# Patient Record
Sex: Female | Born: 1975 | Race: White | Hispanic: No | Marital: Married | Smoking: Former smoker
Health system: Southern US, Community
[De-identification: ages and names within clinical notes are randomized; demographics above are authoritative.]

## PROBLEM LIST (undated history)

## (undated) DIAGNOSIS — Z87442 Personal history of urinary calculi: Secondary | ICD-10-CM

## (undated) DIAGNOSIS — Z8669 Personal history of other diseases of the nervous system and sense organs: Secondary | ICD-10-CM

## (undated) DIAGNOSIS — R350 Frequency of micturition: Secondary | ICD-10-CM

## (undated) DIAGNOSIS — Z86718 Personal history of other venous thrombosis and embolism: Secondary | ICD-10-CM

## (undated) DIAGNOSIS — F1910 Other psychoactive substance abuse, uncomplicated: Secondary | ICD-10-CM

## (undated) DIAGNOSIS — M1990 Unspecified osteoarthritis, unspecified site: Secondary | ICD-10-CM

## (undated) DIAGNOSIS — M549 Dorsalgia, unspecified: Secondary | ICD-10-CM

## (undated) DIAGNOSIS — K589 Irritable bowel syndrome without diarrhea: Secondary | ICD-10-CM

## (undated) DIAGNOSIS — K2900 Acute gastritis without bleeding: Secondary | ICD-10-CM

## (undated) DIAGNOSIS — R51 Headache: Secondary | ICD-10-CM

## (undated) DIAGNOSIS — F419 Anxiety disorder, unspecified: Secondary | ICD-10-CM

## (undated) DIAGNOSIS — G4700 Insomnia, unspecified: Secondary | ICD-10-CM

## (undated) DIAGNOSIS — R3915 Urgency of urination: Secondary | ICD-10-CM

## (undated) DIAGNOSIS — R1110 Vomiting, unspecified: Secondary | ICD-10-CM

## (undated) DIAGNOSIS — M2550 Pain in unspecified joint: Secondary | ICD-10-CM

## (undated) DIAGNOSIS — J45909 Unspecified asthma, uncomplicated: Secondary | ICD-10-CM

## (undated) DIAGNOSIS — K219 Gastro-esophageal reflux disease without esophagitis: Secondary | ICD-10-CM

## (undated) DIAGNOSIS — T7840XA Allergy, unspecified, initial encounter: Secondary | ICD-10-CM

## (undated) DIAGNOSIS — M2540 Effusion, unspecified joint: Secondary | ICD-10-CM

## (undated) DIAGNOSIS — K2970 Gastritis, unspecified, without bleeding: Secondary | ICD-10-CM

## (undated) DIAGNOSIS — J189 Pneumonia, unspecified organism: Secondary | ICD-10-CM

## (undated) DIAGNOSIS — G8929 Other chronic pain: Secondary | ICD-10-CM

## (undated) DIAGNOSIS — G709 Myoneural disorder, unspecified: Secondary | ICD-10-CM

## (undated) DIAGNOSIS — N3281 Overactive bladder: Secondary | ICD-10-CM

## (undated) DIAGNOSIS — IMO0001 Reserved for inherently not codable concepts without codable children: Secondary | ICD-10-CM

## (undated) HISTORY — DX: Unspecified asthma, uncomplicated: J45.909

## (undated) HISTORY — DX: Myoneural disorder, unspecified: G70.9

## (undated) HISTORY — DX: Other psychoactive substance abuse, uncomplicated: F19.10

## (undated) HISTORY — DX: Irritable bowel syndrome, unspecified: K58.9

## (undated) HISTORY — PX: OTHER SURGICAL HISTORY: SHX169

## (undated) HISTORY — DX: Unspecified osteoarthritis, unspecified site: M19.90

## (undated) HISTORY — PX: ECTOPIC PREGNANCY SURGERY: SHX613

## (undated) HISTORY — PX: COLONOSCOPY W/ POLYPECTOMY: SHX1380

## (undated) HISTORY — DX: Personal history of other venous thrombosis and embolism: Z86.718

## (undated) HISTORY — DX: Pneumonia, unspecified organism: J18.9

## (undated) HISTORY — DX: Allergy, unspecified, initial encounter: T78.40XA

---

## 1998-01-29 ENCOUNTER — Emergency Department (HOSPITAL_COMMUNITY): Admission: EM | Admit: 1998-01-29 | Discharge: 1998-01-29 | Payer: Self-pay | Admitting: Emergency Medicine

## 1999-03-12 ENCOUNTER — Emergency Department (HOSPITAL_COMMUNITY): Admission: EM | Admit: 1999-03-12 | Discharge: 1999-03-12 | Payer: Self-pay | Admitting: Emergency Medicine

## 1999-03-12 ENCOUNTER — Encounter: Payer: Self-pay | Admitting: Emergency Medicine

## 1999-08-14 ENCOUNTER — Emergency Department (HOSPITAL_COMMUNITY): Admission: EM | Admit: 1999-08-14 | Discharge: 1999-08-14 | Payer: Self-pay | Admitting: Emergency Medicine

## 1999-08-24 ENCOUNTER — Emergency Department (HOSPITAL_COMMUNITY): Admission: EM | Admit: 1999-08-24 | Discharge: 1999-08-24 | Payer: Self-pay | Admitting: Emergency Medicine

## 2001-09-05 ENCOUNTER — Emergency Department (HOSPITAL_COMMUNITY): Admission: EM | Admit: 2001-09-05 | Discharge: 2001-09-05 | Payer: Self-pay

## 2001-12-15 ENCOUNTER — Emergency Department (HOSPITAL_COMMUNITY): Admission: EM | Admit: 2001-12-15 | Discharge: 2001-12-15 | Payer: Self-pay | Admitting: Emergency Medicine

## 2002-09-20 ENCOUNTER — Encounter: Payer: Self-pay | Admitting: Emergency Medicine

## 2002-09-20 ENCOUNTER — Emergency Department (HOSPITAL_COMMUNITY): Admission: EM | Admit: 2002-09-20 | Discharge: 2002-09-20 | Payer: Self-pay | Admitting: Diagnostic Radiology

## 2002-10-31 ENCOUNTER — Encounter: Admission: RE | Admit: 2002-10-31 | Discharge: 2003-01-29 | Payer: Self-pay | Admitting: *Deleted

## 2002-11-25 ENCOUNTER — Emergency Department (HOSPITAL_COMMUNITY): Admission: EM | Admit: 2002-11-25 | Discharge: 2002-11-25 | Payer: Self-pay | Admitting: Emergency Medicine

## 2003-06-21 ENCOUNTER — Emergency Department (HOSPITAL_COMMUNITY): Admission: EM | Admit: 2003-06-21 | Discharge: 2003-06-21 | Payer: Self-pay | Admitting: Emergency Medicine

## 2003-10-06 ENCOUNTER — Emergency Department (HOSPITAL_COMMUNITY): Admission: EM | Admit: 2003-10-06 | Discharge: 2003-10-06 | Payer: Self-pay | Admitting: Emergency Medicine

## 2004-04-03 ENCOUNTER — Emergency Department (HOSPITAL_COMMUNITY): Admission: EM | Admit: 2004-04-03 | Discharge: 2004-04-03 | Payer: Self-pay | Admitting: Emergency Medicine

## 2004-05-02 ENCOUNTER — Emergency Department (HOSPITAL_COMMUNITY): Admission: EM | Admit: 2004-05-02 | Discharge: 2004-05-02 | Payer: Self-pay | Admitting: Emergency Medicine

## 2004-07-21 ENCOUNTER — Emergency Department (HOSPITAL_COMMUNITY): Admission: EM | Admit: 2004-07-21 | Discharge: 2004-07-21 | Payer: Self-pay | Admitting: Emergency Medicine

## 2004-11-07 ENCOUNTER — Emergency Department (HOSPITAL_COMMUNITY): Admission: EM | Admit: 2004-11-07 | Discharge: 2004-11-07 | Payer: Self-pay | Admitting: Emergency Medicine

## 2005-02-28 ENCOUNTER — Emergency Department (HOSPITAL_COMMUNITY): Admission: EM | Admit: 2005-02-28 | Discharge: 2005-02-28 | Payer: Self-pay | Admitting: Emergency Medicine

## 2005-03-20 ENCOUNTER — Emergency Department (HOSPITAL_COMMUNITY): Admission: EM | Admit: 2005-03-20 | Discharge: 2005-03-20 | Payer: Self-pay | Admitting: Emergency Medicine

## 2005-04-18 ENCOUNTER — Encounter (INDEPENDENT_AMBULATORY_CARE_PROVIDER_SITE_OTHER): Payer: Self-pay | Admitting: Specialist

## 2005-04-18 ENCOUNTER — Observation Stay (HOSPITAL_COMMUNITY): Admission: AD | Admit: 2005-04-18 | Discharge: 2005-04-19 | Payer: Self-pay | Admitting: *Deleted

## 2005-04-20 ENCOUNTER — Inpatient Hospital Stay (HOSPITAL_COMMUNITY): Admission: AD | Admit: 2005-04-20 | Discharge: 2005-04-20 | Payer: Self-pay | Admitting: Obstetrics & Gynecology

## 2005-06-20 ENCOUNTER — Emergency Department (HOSPITAL_COMMUNITY): Admission: EM | Admit: 2005-06-20 | Discharge: 2005-06-21 | Payer: Self-pay | Admitting: Emergency Medicine

## 2005-08-27 ENCOUNTER — Emergency Department (HOSPITAL_COMMUNITY): Admission: EM | Admit: 2005-08-27 | Discharge: 2005-08-27 | Payer: Self-pay | Admitting: Emergency Medicine

## 2006-03-31 ENCOUNTER — Inpatient Hospital Stay (HOSPITAL_COMMUNITY): Admission: AD | Admit: 2006-03-31 | Discharge: 2006-04-01 | Payer: Self-pay | Admitting: Gynecology

## 2006-06-14 ENCOUNTER — Emergency Department (HOSPITAL_COMMUNITY): Admission: EM | Admit: 2006-06-14 | Discharge: 2006-06-14 | Payer: Self-pay | Admitting: Emergency Medicine

## 2006-06-27 ENCOUNTER — Emergency Department (HOSPITAL_COMMUNITY): Admission: EM | Admit: 2006-06-27 | Discharge: 2006-06-27 | Payer: Self-pay | Admitting: Emergency Medicine

## 2006-12-28 ENCOUNTER — Emergency Department (HOSPITAL_COMMUNITY): Admission: EM | Admit: 2006-12-28 | Discharge: 2006-12-28 | Payer: Self-pay | Admitting: Emergency Medicine

## 2007-04-12 ENCOUNTER — Emergency Department (HOSPITAL_COMMUNITY): Admission: EM | Admit: 2007-04-12 | Discharge: 2007-04-12 | Payer: Self-pay | Admitting: Emergency Medicine

## 2008-01-08 ENCOUNTER — Emergency Department (HOSPITAL_COMMUNITY): Admission: EM | Admit: 2008-01-08 | Discharge: 2008-01-09 | Payer: Self-pay | Admitting: Emergency Medicine

## 2009-10-10 ENCOUNTER — Emergency Department (HOSPITAL_COMMUNITY): Admission: EM | Admit: 2009-10-10 | Discharge: 2009-10-10 | Payer: Self-pay | Admitting: Family Medicine

## 2010-06-27 ENCOUNTER — Emergency Department (HOSPITAL_COMMUNITY)
Admission: EM | Admit: 2010-06-27 | Discharge: 2010-06-27 | Payer: Self-pay | Source: Home / Self Care | Admitting: Family Medicine

## 2010-07-07 ENCOUNTER — Emergency Department (HOSPITAL_COMMUNITY)
Admission: EM | Admit: 2010-07-07 | Discharge: 2010-07-07 | Payer: Self-pay | Source: Home / Self Care | Admitting: Emergency Medicine

## 2010-09-12 ENCOUNTER — Inpatient Hospital Stay (INDEPENDENT_AMBULATORY_CARE_PROVIDER_SITE_OTHER)
Admission: RE | Admit: 2010-09-12 | Discharge: 2010-09-12 | Disposition: A | Discharge status: Home / Self Care | Payer: Self-pay | Source: Ambulatory Visit | Attending: Family Medicine | Admitting: Family Medicine

## 2010-09-12 DIAGNOSIS — H60399 Other infective otitis externa, unspecified ear: Secondary | ICD-10-CM

## 2010-09-12 DIAGNOSIS — H7290 Unspecified perforation of tympanic membrane, unspecified ear: Secondary | ICD-10-CM

## 2010-09-20 ENCOUNTER — Inpatient Hospital Stay (INDEPENDENT_AMBULATORY_CARE_PROVIDER_SITE_OTHER)
Admission: RE | Admit: 2010-09-20 | Discharge: 2010-09-20 | Disposition: A | Discharge status: Home / Self Care | Payer: Self-pay | Source: Ambulatory Visit | Attending: Family Medicine | Admitting: Family Medicine

## 2010-09-20 DIAGNOSIS — Z23 Encounter for immunization: Secondary | ICD-10-CM

## 2010-09-20 DIAGNOSIS — T148XXA Other injury of unspecified body region, initial encounter: Secondary | ICD-10-CM

## 2010-10-06 LAB — POCT URINALYSIS DIP (DEVICE)
Ketones, ur: NEGATIVE mg/dL
Protein, ur: 30 mg/dL — AB
pH: 8.5 — ABNORMAL HIGH (ref 5.0–8.0)

## 2010-10-06 LAB — URINE CULTURE: Colony Count: 60000

## 2010-11-28 NOTE — Op Note (Signed)
NAMEPARISSA, Sheena Young            ACCOUNT NO.:  [id number]   MEDICAL RECORD NO.:  [id number]          PATIENT TYPE:  INP   LOCATION:  9317                          FACILITY:  [facility]   PHYSICIAN:  Pershing Cox, M.D.DATE OF BIRTH:  19-Jan-1976   DATE OF PROCEDURE:  04/18/2005  DATE OF DISCHARGE:                                 OPERATIVE REPORT   PREOPERATIVE DIAGNOSIS:  Left ectopic pregnancy.   POSTOPERATIVE DIAGNOSIS:  Left ectopic pregnancy.   PROCEDURE:  Diagnostic laparoscopy and left salpingectomy.   ANESTHESIA:  General endotracheal.   SURGEON:  Pershing Cox, M.D.   ASSISTANT SURGEON:  Chester Holstein. Earlene Plater, M.D.   INDICATIONS FOR PROCEDURE:  This patient ia a 35 year old female gravida 1,  para 0 who came to the emergency room today for pelvic pain.  Her beta HCG  was about 20,000 and an ultrasound showed the left adnexa with a central  fluid collection consistent with an ectopic pregnancy.  No fetal pole was  seen.  She was counseled for the option of surgery or methotrexate, and she  elected to proceed with the surgical procedure and is brought to the  operating room now for this procedure.   OPERATIVE FINDINGS:  There was fluid in the pelvis.  There was diffuse  pelvic inflammatory disease.  There was no upper abdominal disease.  The  right fallopian tube was totally agglutinated at the fimbriated end, and  there were adhesions bailing this fallopian tube and ovary.  On the left,  there was a huge left ectopic pregnancy filling the entire tube.  The tube,  ampulla and fimbria were agglutinated.  There were adhesions covering the  fallopian tube and adhering it to the ovary and to the posterior surface of  uterus.   DESCRIPTION OF PROCEDURE:  The patient was brought to the operating room  with IV in place.  She received 1 gram of Ancef in the holding area.  PAS  stockings were placed on the lower extremities in the operating room.  Supine on the OR table, general  and tracheal anesthesia was administered  after IV sedation.  This was done without difficulty.  She was placed in  stirrups and prepped with Betadine.  A Hulka tenaculum was inserted into the  cervix and used to manipulate the uterus was throughout the procedure.  Marcaine was injected into the subumbilical tissue, and a mid line incision  was made through the umbilicus extending proximal 1 cm down. The  subcutaneous tissues were separated.  The fascia was grasped and incised.  The peritoneum was opened atraumatically, and the Hulka tenaculum was  inserted after a pursestring stitch was placed about fascia.  The  laparoscope was introduced and there were omental adhesions.  For this  reason, it was necessary to place a 5 mm port in the right lower quadrant  which was done atraumatically after Marcaine was injected in the skin.  We  were then able to manipulate the omentum so that we could see the pelvis  well.  A very large ectopic pregnancy was seen on the patient's left.  A  10/12 trocar was placed through an anesthetized skin incision on this side  so that the specimen could be brought out through this incision.  The  fallopian tube was distended.  The fimbria were agglutinated and the tube  was weighted heavily.  For this reason, it could not be easily lifted, and  we started at the area of the tube approximately 1 cm from the uterine  fundus.  Using tripolar cautery, the tube was cauterized and then cut.  Using serial coagulation, the mesosalpinx was brought together with the  tripolar cautery until the mass had been separated from the ovary.  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  This was done with Endo shears.  There  was a large adhesion connecting the ectopic to the uterus and this adhesion  was also taken down prior to the  salpingectomy.  Once the specimen had been  removed with the EndoCatch bag, we irrigated the operative site and pelvis  of all clots.  The pressure was deflated, and there was no evidence of  bleeding at the operative site.  The instruments were removed.  A 0 Vicryl  was used to close the skin edges of the 10/11 port.  The subcutaneous  tissues were brought together with a 0 Vicryl suture at the umbilical site,  and 4-0 Vicryl was used to close the umbilical site and Dermabond was used  to seal the skin of the 10/11 port and the 5 mm port in the right lower  quadrant. The patient tolerated procedure well and was taken to recovery  room in good condition.      Pershing Cox, M.D.  Electronically Signed     MAJ/MEDQ  D:  04/19/2005  T:  04/19/2005  Job:  [id number]

## 2011-04-09 LAB — URINE MICROSCOPIC-ADD ON

## 2011-04-09 LAB — URINALYSIS, ROUTINE W REFLEX MICROSCOPIC
Glucose, UA: NEGATIVE
Nitrite: POSITIVE — AB
Specific Gravity, Urine: 1.023
pH: 6.5

## 2011-04-09 LAB — GC/CHLAMYDIA PROBE AMP, GENITAL
Chlamydia, DNA Probe: NEGATIVE
GC Probe Amp, Genital: NEGATIVE

## 2011-04-09 LAB — POCT PREGNANCY, URINE
Operator id: 277751
Preg Test, Ur: NEGATIVE

## 2011-04-09 LAB — WET PREP, GENITAL

## 2011-05-07 ENCOUNTER — Emergency Department (HOSPITAL_COMMUNITY)
Admission: EM | Admit: 2011-05-07 | Discharge: 2011-05-07 | Disposition: A | Discharge status: Home / Self Care | Payer: Self-pay | Attending: Emergency Medicine | Admitting: Emergency Medicine

## 2011-05-07 DIAGNOSIS — H9209 Otalgia, unspecified ear: Secondary | ICD-10-CM | POA: Insufficient documentation

## 2011-05-07 DIAGNOSIS — R197 Diarrhea, unspecified: Secondary | ICD-10-CM | POA: Insufficient documentation

## 2011-05-07 DIAGNOSIS — R112 Nausea with vomiting, unspecified: Secondary | ICD-10-CM | POA: Insufficient documentation

## 2011-05-07 DIAGNOSIS — R42 Dizziness and giddiness: Secondary | ICD-10-CM | POA: Insufficient documentation

## 2011-05-07 LAB — DIFFERENTIAL
Basophils Relative: 0 % (ref 0–1)
Eosinophils Absolute: 0.1 10*3/uL (ref 0.0–0.7)
Monocytes Absolute: 0.6 10*3/uL (ref 0.1–1.0)
Monocytes Relative: 8 % (ref 3–12)
Neutrophils Relative %: 61 % (ref 43–77)

## 2011-05-07 LAB — CBC
Hemoglobin: 13.6 g/dL (ref 12.0–15.0)
MCHC: 35.4 g/dL (ref 30.0–36.0)
WBC: 8.1 10*3/uL (ref 4.0–10.5)

## 2011-05-07 LAB — COMPREHENSIVE METABOLIC PANEL
Albumin: 4.3 g/dL (ref 3.5–5.2)
BUN: 14 mg/dL (ref 6–23)
Creatinine, Ser: 0.65 mg/dL (ref 0.50–1.10)
GFR calc Af Amer: >90 mL/min (ref >90)
Potassium: 3.6 mEq/L (ref 3.5–5.1)
Total Protein: 7.2 g/dL (ref 6.0–8.3)

## 2011-10-30 ENCOUNTER — Encounter (HOSPITAL_COMMUNITY): Payer: Self-pay

## 2011-10-30 ENCOUNTER — Emergency Department (INDEPENDENT_AMBULATORY_CARE_PROVIDER_SITE_OTHER)
Admission: EM | Admit: 2011-10-30 | Discharge: 2011-10-30 | Disposition: A | Discharge status: Home / Self Care | Payer: Self-pay | Source: Home / Self Care

## 2011-10-30 ENCOUNTER — Emergency Department (INDEPENDENT_AMBULATORY_CARE_PROVIDER_SITE_OTHER): Payer: Self-pay

## 2011-10-30 DIAGNOSIS — M545 Low back pain: Secondary | ICD-10-CM

## 2011-10-30 HISTORY — DX: Acute gastritis without bleeding: K29.00

## 2011-10-30 LAB — POCT URINALYSIS DIP (DEVICE)
Hgb urine dipstick: NEGATIVE
Nitrite: NEGATIVE
Specific Gravity, Urine: 1.02 (ref 1.005–1.030)
Urobilinogen, UA: 1 mg/dL (ref 0.0–1.0)
pH: 7 (ref 5.0–8.0)

## 2011-10-30 MED ORDER — CYCLOBENZAPRINE HCL 10 MG PO TABS: 10.0000 mg | ORAL_TABLET | Freq: Three times a day (TID) | ORAL | Status: AC | PRN

## 2011-10-30 MED ORDER — HYDROCODONE-ACETAMINOPHEN 7.5-750 MG PO TABS: 1.0000 | ORAL_TABLET | Freq: Four times a day (QID) | ORAL | Status: AC | PRN

## 2011-10-30 MED ORDER — PREDNISONE 20 MG PO TABS: 20.0000 mg | ORAL_TABLET | Freq: Two times a day (BID) | ORAL | Status: AC

## 2011-10-30 MED ORDER — HYDROMORPHONE HCL PF 1 MG/ML IJ SOLN
0.5000 mg | Freq: Once | INTRAMUSCULAR | Status: AC
  Administered 2011-10-30: 0.5 mg via INTRAMUSCULAR

## 2011-10-30 MED ORDER — HYDROMORPHONE HCL PF 1 MG/ML IJ SOLN
INTRAMUSCULAR | Status: AC
  Filled 2011-10-30: qty 1

## 2011-10-30 MED ORDER — ONDANSETRON 4 MG PO TBDP
ORAL_TABLET | ORAL | Status: AC
  Filled 2011-10-30: qty 2

## 2011-10-30 MED ORDER — ONDANSETRON 4 MG PO TBDP
8.0000 mg | ORAL_TABLET | Freq: Once | ORAL | Status: AC
  Administered 2011-10-30: 8 mg via ORAL

## 2011-10-30 NOTE — Discharge Instructions (Signed)
Ice packs to your lower back 3-4 times a day for 15-20 minutes. Do not drive or operate machinery while taking Flexeril or Hydrocodone. Call Dr Dietrich Pates office to schedule follow up .   Back Pain, Adult Back pain is very common. The pain often gets better over time. The cause of back pain is usually not dangerous. Most people can learn to manage their back pain on their own.  HOME CARE   Stay active. Start with short walks on flat ground if you can. Try to walk farther each day.   Do not sit, drive, or stand in one place for more than 30 minutes. Do not stay in bed.   Do not avoid exercise or work. Activity can help your back heal faster.   Be careful when you bend or lift an object. Bend at your knees, keep the object close to you, and do not twist.   Sleep on a firm mattress. Lie on your side, and bend your knees. If you lie on your back, put a pillow under your knees.   Only take medicines as told by your doctor.   Put ice on the injured area.   Put ice in a plastic bag.   Place a towel between your skin and the bag.   Leave the ice on for 15 to 20 minutes, 3 to 4 times a day for the first 2 to 3 days. After that, you can switch between ice and heat packs.   Ask your doctor about back exercises or massage.   Avoid feeling anxious or stressed. Find good ways to deal with stress, such as exercise.  GET HELP RIGHT AWAY IF:   Your pain does not go away with rest or medicine.   Your pain does not go away in 1 week.   You have new problems.   You do not feel well.   The pain spreads into your legs.   You cannot control when you poop (bowel movement) or pee (urinate).   Your arms or legs feel weak or lose feeling (numbness).   You feel sick to your stomach (nauseous) or throw up (vomit).   You have belly (abdominal) pain.   You feel like you may pass out (faint).  MAKE SURE YOU:   Understand these instructions.   Will watch your condition.   Will get help right  away if you are not doing well or get worse.  Document Released: 12/16/2007 Document Revised: 06/18/2011 Document Reviewed: 11/17/2010 [facility] Patient Information 2012 [place], [place].

## 2011-10-30 NOTE — ED Notes (Signed)
10 min post dilaudid injection, pt able to ambulate to BR w assiatance

## 2011-10-30 NOTE — ED Notes (Signed)
zofran given PO, instead of in injection form

## 2011-10-30 NOTE — ED Notes (Addendum)
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 Hard to get up, and has had to use heating pad and 2 hydrocodone from a friend to get any relief. 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 Tearful. Moved from [facility] to stretcher to facilitate evaluation, caused observable pain, tearful; denies other injury, denies saddle anesthesia

## 2011-10-30 NOTE — ED Provider Notes (Signed)
History     CSN: [id number]  Arrival date & time 10/30/11  0931   None     Chief Complaint  Patient presents with  . Back Pain    (Consider location/radiation/quality/duration/timing/severity/associated sxs/prior treatment) HPI Comments: Patient presents today for lumbar back pain.  She states that she was lifting a wheelbarrow into the back of a truck yesterday when she felt a sharp twinge, and then had some soreness in her back for the remainder of the day but was able to continue working - she is self employed. At the end of the day after sitting in her car,  she went to get out and had severe back pain that has persisted since. She has radiation down her right leg only when she stands up. She denies numbness or tingling. 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 She last said has seen an orthopedic back doctor over 10 years ago. She states this pain is different. She did take 2 hydrocodone at 7 AM this morning which he borrowed from a friend without any pain relief. She also tried Aleve yesterday without relief. She has had nausea which he contributes to the severity of pain, no vomiting. She did have one episode of dysuria this morning. No urinary urgency or frequency.     Past Medical History  Diagnosis Date  . Gastritis, acute     History reviewed. No pertinent past surgical history.  History reviewed. No pertinent family history.  History  Substance Use Topics  . Smoking status: Not on file  . Smokeless tobacco: Not on file  . Alcohol Use: 1.2 oz/week    2 Glasses of wine per week    OB History    Grav Para Term Preterm Abortions TAB SAB Ect Mult Living                  Review of Systems  Constitutional: Negative for fever and chills.  Gastrointestinal: Negative for nausea, vomiting and abdominal pain.  Musculoskeletal: Positive for gait problem.  Neurological: Negative for weakness and numbness.     Allergies  Oxycodone  Home Medications   Current Outpatient Rx  Name Route Sig Dispense Refill  . CYCLOBENZAPRINE HCL 10 MG PO TABS Oral Take 1 tablet (10 mg total) by mouth 3 (three) times daily as needed for muscle spasms. 15 tablet 0  . HYDROCODONE-ACETAMINOPHEN 7.5-750 MG PO TABS Oral Take 1 tablet by mouth every 6 (six) hours as needed for pain. 15 tablet 0  . PREDNISONE 20 MG PO TABS Oral Take 1 tablet (20 mg total) by mouth 2 (two) times daily. 10 tablet 0    BP 109/67  Pulse 78  Temp(Src) 98 F (36.7 C) (Oral)  Resp 24  SpO2 97%  LMP 10/11/2011  Physical Exam  Nursing note and vitals reviewed. Constitutional: She appears well-developed and well-nourished. She appears distressed.  HENT:  Head: Normocephalic and atraumatic.  Cardiovascular: Normal rate and regular rhythm.   Pulmonary/Chest: Effort normal and breath sounds normal. No respiratory distress.  Abdominal: Soft. Bowel sounds are normal. She exhibits no distension and no mass. There is no tenderness.  Musculoskeletal:       Lumbar back: She exhibits decreased range of motion, tenderness and spasm. She exhibits no bony tenderness.       Back:       Pt stands flexed forward at hips. Guarding, and unable to perform ROM due to pain. Nontender  SI joints and sciatic notches.   Neurological: She is alert.  Skin: Skin is warm and dry. No rash noted.  Psychiatric: She has a normal mood and affect.    ED Course  Procedures (including critical care time)  Labs Reviewed  POCT URINALYSIS DIP (DEVICE) - Abnormal; Notable for the following:    Bilirubin Urine SMALL (*)    Ketones, ur 40 (*)    Protein, ur 100 (*)    All other components within normal limits  POCT PREGNANCY, URINE   Dg Lumbar Spine Complete  10/30/2011  *RADIOLOGY REPORT*  Clinical Data: Low back pain down right leg after lifting a wheelbarrow.  LUMBAR SPINE - COMPLETE 4+ VIEW  Comparison: 06/27/2006  Findings: Hypoplastic last rib pair. Four  non-rib bearing lumbar-type segments with a partially sacralized L5 segment with an assimilation joint between the enlarged left transverse process and sacrum. Osseous mineralization normal. Minimal levoconvex scoliosis. Vertebral body and disc space heights maintained. No acute fracture, subluxation or bone destruction. No spondylolysis. SI joints symmetric.  IMPRESSION: No acute lumbar spine abnormalities.  Original Report Authenticated By: Lollie Marrow, M.D.     1. Acute lumbar back pain       MDM  LS spine xray reviewed by myself and radiologist.         Melody Comas, PA 10/31/11 [phone]

## 2011-11-01 NOTE — ED Provider Notes (Signed)
Medical screening examination/treatment/procedure(s) were performed by non-physician practitioner and as supervising physician I was immediately available for consultation/collaboration.  Raynald Blend, MD 11/01/11 [phone]

## 2011-11-24 ENCOUNTER — Ambulatory Visit: Payer: Self-pay

## 2011-11-24 ENCOUNTER — Ambulatory Visit: Payer: Self-pay | Admitting: Internal Medicine

## 2011-11-24 VITALS — BP 117/71 | HR 81 | Temp 98.4°F | Resp 16

## 2011-11-24 DIAGNOSIS — S99911A Unspecified injury of right ankle, initial encounter: Secondary | ICD-10-CM

## 2011-11-24 DIAGNOSIS — M79609 Pain in unspecified limb: Secondary | ICD-10-CM

## 2011-11-24 DIAGNOSIS — S8261XA Displaced fracture of lateral malleolus of right fibula, initial encounter for closed fracture: Secondary | ICD-10-CM

## 2011-11-24 DIAGNOSIS — S8263XA Displaced fracture of lateral malleolus of unspecified fibula, initial encounter for closed fracture: Secondary | ICD-10-CM

## 2011-11-24 MED ORDER — TRAMADOL HCL 50 MG PO TABS: 50.0000 mg | ORAL_TABLET | Freq: Three times a day (TID) | ORAL | Status: AC | PRN

## 2011-11-24 NOTE — Patient Instructions (Signed)
Elevation ice wear the cam walker . Ultram for pain as directed. return to the  Office as directed.

## 2011-11-24 NOTE — Progress Notes (Signed)
  Subjective:    Patient ID: Sheena Young, female    DOB: 01-May-1976, 36 y.o.   MRN: [id number]  HPI Pain in right ankle 1.5 weeks ago, pain 5/10 moderate in severity worse with motion. Wearing a stirrup brace she borrowed form a Cabin crew but was not wearing it this am and she got up on her toes and felt her ankle crumple. Increased pain and swelling to the lateral malleolus now. Pain 5/10 worse with motion.    Review of Systems  Constitutional: Negative.   HENT: Negative.   Eyes: Negative.   Respiratory: Negative.   Cardiovascular: Negative.   Gastrointestinal: Negative.   Genitourinary: Negative.   Musculoskeletal: Positive for joint swelling and gait problem.  Skin: Negative.   Neurological: Negative.   Hematological: Negative.   Psychiatric/Behavioral: Negative.   All other systems reviewed and are negative.   a    Objective:   Physical Exam  Nursing note and vitals reviewed. Constitutional: She is oriented to person, place, and time. She appears well-developed and well-nourished.  HENT:  Head: Normocephalic and atraumatic.  Cardiovascular: Normal rate and regular rhythm.   Musculoskeletal: She exhibits tenderness.       tenderness swelling to the lateral malleolus of the right ankle.  Neurological: She is alert and oriented to person, place, and time.  Skin: Skin is warm and dry.  Psychiatric: She has a normal mood and affect. Her behavior is normal. Judgment and thought content normal.      [facility] reading (PRIMARY) by  Dr. Mindi Junker avulsion fracture of the right medial malleolus..     Assessment & Plan:  Ankle xray fracture will put in cam walker and re eval in 3 weeks.

## 2011-12-08 ENCOUNTER — Ambulatory Visit (INDEPENDENT_AMBULATORY_CARE_PROVIDER_SITE_OTHER): Payer: Self-pay | Admitting: Family Medicine

## 2011-12-08 ENCOUNTER — Ambulatory Visit: Payer: Self-pay

## 2011-12-08 VITALS — BP 105/67 | HR 81 | Temp 98.1°F | Resp 16

## 2011-12-08 DIAGNOSIS — M25579 Pain in unspecified ankle and joints of unspecified foot: Secondary | ICD-10-CM

## 2011-12-08 DIAGNOSIS — S82899A Other fracture of unspecified lower leg, initial encounter for closed fracture: Secondary | ICD-10-CM

## 2011-12-08 MED ORDER — HYDROCODONE-ACETAMINOPHEN 5-500 MG PO TABS: 1.0000 | ORAL_TABLET | Freq: Three times a day (TID) | ORAL | Status: AC | PRN

## 2011-12-08 NOTE — Progress Notes (Signed)
  Subjective:    Patient ID: Sheena Young, female    DOB: 08/15/1975, 36 y.o.   MRN: [id number]  HPI 36 yo female here to follow-up right posterior malleolar avulsion fracture.  Seen here 5/14.  Was placed in CAM walker.  Used crutches for a bout a week.  Then was off them.  Uses CAM when goes out of house but around house uses basic stirrup braces.  Was trying to walk without brace or CAM today and foot rolled and now having worse pain and swelling again.  Coulnd't bear weight and had to use crutches again.    Review of Systems Negative except as per HPI     Objective:   Physical Exam  Constitutional: She appears well-developed.  Pulmonary/Chest: Effort normal.  Musculoskeletal:       Right ankle: She exhibits swelling. tenderness. Lateral malleolus tenderness found.  Neurological: She is alert.    [facility] Primary radiology reading by Dr. Georgiana Shore: Post malleolar fracture more displaced.  Otherwise unchanged.       Assessment & Plan:  Ankle fracture - stay in CAM exclusively for 4 weeks. Discussed with Dr. Althea Charon

## 2012-10-24 ENCOUNTER — Other Ambulatory Visit: Payer: Self-pay | Admitting: Orthopedic Surgery

## 2012-11-01 ENCOUNTER — Encounter (HOSPITAL_COMMUNITY): Payer: Self-pay | Admitting: Pharmacy Technician

## 2012-11-01 ENCOUNTER — Ambulatory Visit (HOSPITAL_COMMUNITY)
Admission: RE | Admit: 2012-11-01 | Discharge: 2012-11-01 | Disposition: A | Discharge status: Home / Self Care | Payer: BC Managed Care – PPO | Source: Ambulatory Visit | Attending: Orthopedic Surgery | Admitting: Orthopedic Surgery

## 2012-11-01 ENCOUNTER — Encounter (HOSPITAL_COMMUNITY)
Admission: RE | Admit: 2012-11-01 | Discharge: 2012-11-01 | Disposition: A | Discharge status: Home / Self Care | Payer: BC Managed Care – PPO | Source: Ambulatory Visit | Attending: Orthopedic Surgery | Admitting: Orthopedic Surgery

## 2012-11-01 ENCOUNTER — Encounter (HOSPITAL_COMMUNITY): Payer: Self-pay

## 2012-11-01 DIAGNOSIS — Z01810 Encounter for preprocedural cardiovascular examination: Secondary | ICD-10-CM | POA: Insufficient documentation

## 2012-11-01 DIAGNOSIS — Z01818 Encounter for other preprocedural examination: Secondary | ICD-10-CM | POA: Insufficient documentation

## 2012-11-01 DIAGNOSIS — R0602 Shortness of breath: Secondary | ICD-10-CM | POA: Insufficient documentation

## 2012-11-01 DIAGNOSIS — Z01812 Encounter for preprocedural laboratory examination: Secondary | ICD-10-CM | POA: Insufficient documentation

## 2012-11-01 DIAGNOSIS — M545 Low back pain, unspecified: Secondary | ICD-10-CM | POA: Insufficient documentation

## 2012-11-01 HISTORY — DX: Headache: R51

## 2012-11-01 HISTORY — DX: Personal history of other diseases of the nervous system and sense organs: Z86.69

## 2012-11-01 HISTORY — DX: Gastro-esophageal reflux disease without esophagitis: K21.9

## 2012-11-01 HISTORY — DX: Frequency of micturition: R35.0

## 2012-11-01 HISTORY — DX: Pain in unspecified joint: M25.50

## 2012-11-01 HISTORY — DX: Effusion, unspecified joint: M25.40

## 2012-11-01 HISTORY — DX: Anxiety disorder, unspecified: F41.9

## 2012-11-01 HISTORY — DX: Overactive bladder: N32.81

## 2012-11-01 HISTORY — DX: Insomnia, unspecified: G47.00

## 2012-11-01 HISTORY — DX: Gastritis, unspecified, without bleeding: K29.70

## 2012-11-01 HISTORY — DX: Urgency of urination: R39.15

## 2012-11-01 HISTORY — DX: Dorsalgia, unspecified: M54.9

## 2012-11-01 HISTORY — DX: Other chronic pain: G89.29

## 2012-11-01 HISTORY — DX: Vomiting, unspecified: R11.10

## 2012-11-01 LAB — URINALYSIS, ROUTINE W REFLEX MICROSCOPIC
Bilirubin Urine: NEGATIVE
Hgb urine dipstick: NEGATIVE
Ketones, ur: NEGATIVE mg/dL
Nitrite: NEGATIVE
Protein, ur: NEGATIVE mg/dL
Urobilinogen, UA: 1 mg/dL (ref 0.0–1.0)

## 2012-11-01 LAB — CBC WITH DIFFERENTIAL/PLATELET
Basophils Relative: 0 % (ref 0–1)
Eosinophils Absolute: 0.2 10*3/uL (ref 0.0–0.7)
Eosinophils Relative: 2 % (ref 0–5)
Lymphs Abs: 2.8 10*3/uL (ref 0.7–4.0)
MCH: 32.8 pg (ref 26.0–34.0)
MCHC: 35.5 g/dL (ref 30.0–36.0)
MCV: 92.3 fL (ref 78.0–100.0)
Monocytes Relative: 7 % (ref 3–12)
Neutrophils Relative %: 67 % (ref 43–77)
Platelets: 299 10*3/uL (ref 150–400)
RBC: 4.15 MIL/uL (ref 3.87–5.11)

## 2012-11-01 LAB — TYPE AND SCREEN
ABO/RH(D): O POS
Antibody Screen: NEGATIVE

## 2012-11-01 LAB — COMPREHENSIVE METABOLIC PANEL
Albumin: 3.9 g/dL (ref 3.5–5.2)
BUN: 15 mg/dL (ref 6–23)
Calcium: 9.1 mg/dL (ref 8.4–10.5)
GFR calc Af Amer: >90 mL/min (ref >90)
Glucose, Bld: 80 mg/dL (ref 70–99)
Potassium: 3.7 mEq/L (ref 3.5–5.1)
Sodium: 138 mEq/L (ref 135–145)
Total Protein: 7.1 g/dL (ref 6.0–8.3)

## 2012-11-01 LAB — SURGICAL PCR SCREEN
MRSA, PCR: NEGATIVE
Staphylococcus aureus: NEGATIVE

## 2012-11-01 LAB — PROTIME-INR: Prothrombin Time: 12.8 seconds (ref 11.6–15.2)

## 2012-11-01 MED ORDER — POVIDONE-IODINE 7.5 % EX SOLN: Freq: Once | CUTANEOUS | Status: DC

## 2012-11-01 MED ORDER — CEFAZOLIN SODIUM-DEXTROSE 2-3 GM-% IV SOLR: 2.0000 g | INTRAVENOUS | Status: DC

## 2012-11-01 NOTE — Progress Notes (Signed)
Pt doesn't have a cardiologist  Denies echo/stress test/heart cath  Pt doesn't have a medical Md  Denies EKG or CXR in past yr

## 2012-11-01 NOTE — Pre-Procedure Instructions (Signed)
Sheena Young  11/01/2012   Your procedure is scheduled on: Thurs, April 24 @ 7:30 AM  Report to [facility] at 5:30 AM.  Call this number if you have problems the morning of surgery: [phone]   Remember:   Do not eat food or drink liquids after midnight.   Take these medicines the morning of surgery with A SIP OF WATER: Pain Pill(if needed)               Stop taking your Anaprox.No Goody's,BC's,Ibuprofen,Aspirin,or any Herbal Medications   Do not wear jewelry, make-up or nail polish.  Do not wear lotions, powders, or perfumes. You may wear deodorant.  Do not shave 48 hours prior to surgery.   Do not bring valuables to the hospital.  Contacts, dentures or bridgework may not be worn into surgery.  Leave suitcase in the car. After surgery it may be brought to your room.  For patients admitted to the hospital, checkout time is 11:00 AM the day of  discharge.   Patients discharged the day of surgery will not be allowed to drive  home.  Special Instructions: Shower using CHG 2 nights before surgery and the night before surgery.  If you shower the day of surgery use CHG.  Use special wash - you have one bottle of CHG for all showers.  You should use approximately 1/3 of the bottle for each shower.   Please read over the following fact sheets that you were given: Pain Booklet, Coughing and Deep Breathing, Blood Transfusion Information, MRSA Information and Surgical Site Infection Prevention

## 2012-11-02 MED ORDER — CEFAZOLIN SODIUM-DEXTROSE 2-3 GM-% IV SOLR
2.0000 g | INTRAVENOUS | Status: AC
  Administered 2012-11-03: 2 g via INTRAVENOUS
  Filled 2012-11-02: qty 50

## 2012-11-03 ENCOUNTER — Ambulatory Visit (HOSPITAL_COMMUNITY): Payer: BC Managed Care – PPO | Admitting: Anesthesiology

## 2012-11-03 ENCOUNTER — Ambulatory Visit (HOSPITAL_COMMUNITY): Payer: BC Managed Care – PPO

## 2012-11-03 ENCOUNTER — Encounter (HOSPITAL_COMMUNITY): Payer: Self-pay | Admitting: *Deleted

## 2012-11-03 ENCOUNTER — Encounter (HOSPITAL_COMMUNITY)
Admission: RE | Disposition: A | Discharge status: Home / Self Care | Payer: Self-pay | Source: Ambulatory Visit | Attending: Orthopedic Surgery

## 2012-11-03 ENCOUNTER — Encounter (HOSPITAL_COMMUNITY): Payer: Self-pay | Admitting: Anesthesiology

## 2012-11-03 ENCOUNTER — Ambulatory Visit (HOSPITAL_COMMUNITY)
Admission: RE | Admit: 2012-11-03 | Discharge: 2012-11-03 | Disposition: A | Discharge status: Home / Self Care | Payer: BC Managed Care – PPO | Source: Ambulatory Visit | Attending: Orthopedic Surgery | Admitting: Orthopedic Surgery

## 2012-11-03 DIAGNOSIS — Z885 Allergy status to narcotic agent status: Secondary | ICD-10-CM | POA: Insufficient documentation

## 2012-11-03 DIAGNOSIS — F1210 Cannabis abuse, uncomplicated: Secondary | ICD-10-CM | POA: Insufficient documentation

## 2012-11-03 DIAGNOSIS — F411 Generalized anxiety disorder: Secondary | ICD-10-CM | POA: Insufficient documentation

## 2012-11-03 DIAGNOSIS — K219 Gastro-esophageal reflux disease without esophagitis: Secondary | ICD-10-CM | POA: Insufficient documentation

## 2012-11-03 DIAGNOSIS — Z79899 Other long term (current) drug therapy: Secondary | ICD-10-CM | POA: Insufficient documentation

## 2012-11-03 DIAGNOSIS — F17200 Nicotine dependence, unspecified, uncomplicated: Secondary | ICD-10-CM | POA: Insufficient documentation

## 2012-11-03 DIAGNOSIS — R3915 Urgency of urination: Secondary | ICD-10-CM | POA: Insufficient documentation

## 2012-11-03 DIAGNOSIS — M5126 Other intervertebral disc displacement, lumbar region: Secondary | ICD-10-CM | POA: Insufficient documentation

## 2012-11-03 DIAGNOSIS — G4700 Insomnia, unspecified: Secondary | ICD-10-CM | POA: Insufficient documentation

## 2012-11-03 DIAGNOSIS — R350 Frequency of micturition: Secondary | ICD-10-CM | POA: Insufficient documentation

## 2012-11-03 DIAGNOSIS — Z791 Long term (current) use of non-steroidal anti-inflammatories (NSAID): Secondary | ICD-10-CM | POA: Insufficient documentation

## 2012-11-03 DIAGNOSIS — N318 Other neuromuscular dysfunction of bladder: Secondary | ICD-10-CM | POA: Insufficient documentation

## 2012-11-03 HISTORY — PX: LUMBAR LAMINECTOMY/DECOMPRESSION MICRODISCECTOMY: SHX5026

## 2012-11-03 SURGERY — LUMBAR LAMINECTOMY/DECOMPRESSION MICRODISCECTOMY
Anesthesia: General | Site: Spine Lumbar | Laterality: Right | Wound class: Clean

## 2012-11-03 MED ORDER — LIDOCAINE HCL (CARDIAC) 20 MG/ML IV SOLN
INTRAVENOUS | Status: DC | PRN
  Administered 2012-11-03: 65 mg via INTRAVENOUS

## 2012-11-03 MED ORDER — ARTIFICIAL TEARS OP OINT
TOPICAL_OINTMENT | OPHTHALMIC | Status: DC | PRN
  Administered 2012-11-03: 1 via OPHTHALMIC

## 2012-11-03 MED ORDER — THROMBIN 20000 UNITS EX SOLR
CUTANEOUS | Status: AC
  Filled 2012-11-03: qty 20000

## 2012-11-03 MED ORDER — HYDROMORPHONE HCL PF 1 MG/ML IJ SOLN
0.2500 mg | INTRAMUSCULAR | Status: DC | PRN
  Administered 2012-11-03 (×4): 0.5 mg via INTRAVENOUS

## 2012-11-03 MED ORDER — DEXTROSE 5 % IV SOLN
INTRAVENOUS | Status: DC | PRN
  Administered 2012-11-03 (×2): via INTRAVENOUS

## 2012-11-03 MED ORDER — MIDAZOLAM HCL 5 MG/5ML IJ SOLN
INTRAMUSCULAR | Status: DC | PRN
  Administered 2012-11-03: 2 mg via INTRAVENOUS

## 2012-11-03 MED ORDER — PROPOFOL 10 MG/ML IV BOLUS
INTRAVENOUS | Status: DC | PRN
  Administered 2012-11-03: 20 mg via INTRAVENOUS
  Administered 2012-11-03: 50 mg via INTRAVENOUS
  Administered 2012-11-03: 180 mg via INTRAVENOUS
  Administered 2012-11-03: 50 mg via INTRAVENOUS

## 2012-11-03 MED ORDER — SODIUM CHLORIDE 0.9 % IJ SOLN: 3.0000 mL | INTRAMUSCULAR | Status: DC | PRN

## 2012-11-03 MED ORDER — BUPIVACAINE-EPINEPHRINE 0.25% -1:200000 IJ SOLN
INTRAMUSCULAR | Status: DC | PRN
  Administered 2012-11-03: 14 mL

## 2012-11-03 MED ORDER — GLYCOPYRROLATE 0.2 MG/ML IJ SOLN
INTRAMUSCULAR | Status: DC | PRN
  Administered 2012-11-03: 0.4 mg via INTRAVENOUS

## 2012-11-03 MED ORDER — PROMETHAZINE HCL 25 MG/ML IJ SOLN
INTRAMUSCULAR | Status: AC
  Filled 2012-11-03: qty 1

## 2012-11-03 MED ORDER — PROMETHAZINE HCL 25 MG/ML IJ SOLN
6.2500 mg | INTRAMUSCULAR | Status: DC | PRN
  Administered 2012-11-03: 6.25 mg via INTRAVENOUS

## 2012-11-03 MED ORDER — ZOLPIDEM TARTRATE 5 MG PO TABS: 5.0000 mg | ORAL_TABLET | Freq: Every evening | ORAL | Status: DC | PRN

## 2012-11-03 MED ORDER — CEFAZOLIN SODIUM 1-5 GM-% IV SOLN: 1.0000 g | Freq: Three times a day (TID) | INTRAVENOUS | Status: DC

## 2012-11-03 MED ORDER — MENTHOL 3 MG MT LOZG: 1.0000 | LOZENGE | OROMUCOSAL | Status: DC | PRN

## 2012-11-03 MED ORDER — DIAZEPAM 5 MG PO TABS: 5.0000 mg | ORAL_TABLET | Freq: Four times a day (QID) | ORAL | Status: DC | PRN

## 2012-11-03 MED ORDER — HYDROMORPHONE HCL PF 1 MG/ML IJ SOLN
INTRAMUSCULAR | Status: AC
  Filled 2012-11-03: qty 1

## 2012-11-03 MED ORDER — SODIUM CHLORIDE 0.9 % IJ SOLN: 3.0000 mL | Freq: Two times a day (BID) | INTRAMUSCULAR | Status: DC

## 2012-11-03 MED ORDER — HYDROCODONE-ACETAMINOPHEN 5-325 MG PO TABS
ORAL_TABLET | ORAL | Status: AC
  Administered 2012-11-03: 2
  Filled 2012-11-03: qty 2

## 2012-11-03 MED ORDER — 0.9 % SODIUM CHLORIDE (POUR BTL) OPTIME
TOPICAL | Status: DC | PRN
  Administered 2012-11-03: 1000 mL

## 2012-11-03 MED ORDER — DOCUSATE SODIUM 100 MG PO CAPS: 100.0000 mg | ORAL_CAPSULE | Freq: Two times a day (BID) | ORAL | Status: DC

## 2012-11-03 MED ORDER — SODIUM CHLORIDE 0.9 % IV SOLN: 250.0000 mL | INTRAVENOUS | Status: DC

## 2012-11-03 MED ORDER — INDIGOTINDISULFONATE SODIUM 8 MG/ML IJ SOLN
INTRAMUSCULAR | Status: DC | PRN
  Administered 2012-11-03: .2 mL

## 2012-11-03 MED ORDER — ONDANSETRON HCL 4 MG/2ML IJ SOLN
INTRAMUSCULAR | Status: DC | PRN
  Administered 2012-11-03: 4 mg via INTRAVENOUS

## 2012-11-03 MED ORDER — ACETAMINOPHEN 10 MG/ML IV SOLN
INTRAVENOUS | Status: AC
  Administered 2012-11-03: 1000 mg via INTRAVENOUS
  Filled 2012-11-03: qty 100

## 2012-11-03 MED ORDER — METHYLPREDNISOLONE ACETATE 40 MG/ML IJ SUSP
INTRAMUSCULAR | Status: AC
  Filled 2012-11-03: qty 1

## 2012-11-03 MED ORDER — LACTATED RINGERS IV SOLN
INTRAVENOUS | Status: DC | PRN
  Administered 2012-11-03 (×2): via INTRAVENOUS

## 2012-11-03 MED ORDER — METHYLPREDNISOLONE ACETATE 40 MG/ML IJ SUSP
INTRAMUSCULAR | Status: DC | PRN
  Administered 2012-11-03: 40 mg

## 2012-11-03 MED ORDER — FENTANYL CITRATE 0.05 MG/ML IJ SOLN
INTRAMUSCULAR | Status: DC | PRN
  Administered 2012-11-03 (×3): 50 ug via INTRAVENOUS
  Administered 2012-11-03: 100 ug via INTRAVENOUS

## 2012-11-03 MED ORDER — THROMBIN 20000 UNITS EX SOLR
CUTANEOUS | Status: DC | PRN
  Administered 2012-11-03: 08:00:00 via TOPICAL

## 2012-11-03 MED ORDER — ONDANSETRON HCL 4 MG/2ML IJ SOLN: 4.0000 mg | INTRAMUSCULAR | Status: DC | PRN

## 2012-11-03 MED ORDER — LIDOCAINE HCL 4 % MT SOLN
OROMUCOSAL | Status: DC | PRN
  Administered 2012-11-03: 4 mL via TOPICAL

## 2012-11-03 MED ORDER — ACETAMINOPHEN 325 MG PO TABS: 650.0000 mg | ORAL_TABLET | ORAL | Status: DC | PRN

## 2012-11-03 MED ORDER — NEOSTIGMINE METHYLSULFATE 1 MG/ML IJ SOLN
INTRAMUSCULAR | Status: DC | PRN
  Administered 2012-11-03: 3 mg via INTRAVENOUS

## 2012-11-03 MED ORDER — INDIGOTINDISULFONATE SODIUM 8 MG/ML IJ SOLN
INTRAMUSCULAR | Status: AC
  Filled 2012-11-03: qty 5

## 2012-11-03 MED ORDER — BUPIVACAINE-EPINEPHRINE PF 0.25-1:200000 % IJ SOLN
INTRAMUSCULAR | Status: AC
  Filled 2012-11-03: qty 30

## 2012-11-03 MED ORDER — PHENOL 1.4 % MT LIQD: 1.0000 | OROMUCOSAL | Status: DC | PRN

## 2012-11-03 MED ORDER — ALBUTEROL SULFATE HFA 108 (90 BASE) MCG/ACT IN AERS
INHALATION_SPRAY | RESPIRATORY_TRACT | Status: DC | PRN
  Administered 2012-11-03 (×2): 4 via RESPIRATORY_TRACT

## 2012-11-03 MED ORDER — ALUM & MAG HYDROXIDE-SIMETH 200-200-20 MG/5ML PO SUSP: 30.0000 mL | Freq: Four times a day (QID) | ORAL | Status: DC | PRN

## 2012-11-03 MED ORDER — DEXAMETHASONE SODIUM PHOSPHATE 4 MG/ML IJ SOLN
INTRAMUSCULAR | Status: DC | PRN
  Administered 2012-11-03: 8 mg via INTRAVENOUS

## 2012-11-03 MED ORDER — HYDROCODONE-ACETAMINOPHEN 5-325 MG PO TABS: 1.0000 | ORAL_TABLET | ORAL | Status: DC | PRN

## 2012-11-03 MED ORDER — MORPHINE SULFATE 2 MG/ML IJ SOLN: 1.0000 mg | INTRAMUSCULAR | Status: DC | PRN

## 2012-11-03 MED ORDER — ROCURONIUM BROMIDE 100 MG/10ML IV SOLN
INTRAVENOUS | Status: DC | PRN
  Administered 2012-11-03: 50 mg via INTRAVENOUS

## 2012-11-03 MED ORDER — HEMOSTATIC AGENTS (NO CHARGE) OPTIME
TOPICAL | Status: DC | PRN
  Administered 2012-11-03: 1 via TOPICAL

## 2012-11-03 MED ORDER — ACETAMINOPHEN 650 MG RE SUPP: 650.0000 mg | RECTAL | Status: DC | PRN

## 2012-11-03 MED ORDER — OXYCODONE-ACETAMINOPHEN 5-325 MG PO TABS: 1.0000 | ORAL_TABLET | ORAL | Status: DC | PRN

## 2012-11-03 SURGICAL SUPPLY — 67 items
APL SKNCLS STERI-STRIP NONHPOA (GAUZE/BANDAGES/DRESSINGS) ×1
BENZOIN TINCTURE PRP APPL 2/3 (GAUZE/BANDAGES/DRESSINGS) ×1 IMPLANT
BUR ROUND PRECISION 4.0 (BURR) ×2 IMPLANT
CANISTER SUCTION 2500CC (MISCELLANEOUS) ×2 IMPLANT
CLOTH BEACON ORANGE TIMEOUT ST (SAFETY) ×2 IMPLANT
CLSR STERI-STRIP ANTIMIC 1/2X4 (GAUZE/BANDAGES/DRESSINGS) ×1 IMPLANT
CORDS BIPOLAR (ELECTRODE) ×2 IMPLANT
COVER SURGICAL LIGHT HANDLE (MISCELLANEOUS) ×2 IMPLANT
DRAIN CHANNEL 15F RND FF W/TCR (WOUND CARE) IMPLANT
DRAPE POUCH INSTRU U-SHP 10X18 (DRAPES) ×4 IMPLANT
DRAPE SURG 17X23 STRL (DRAPES) ×8 IMPLANT
DURAPREP 26ML APPLICATOR (WOUND CARE) ×2 IMPLANT
ELECT BLADE 4.0 EZ CLEAN MEGAD (MISCELLANEOUS)
ELECT CAUTERY BLADE 6.4 (BLADE) ×2 IMPLANT
ELECT REM PT RETURN 9FT ADLT (ELECTROSURGICAL) ×2
ELECTRODE BLDE 4.0 EZ CLN MEGD (MISCELLANEOUS) IMPLANT
ELECTRODE REM PT RTRN 9FT ADLT (ELECTROSURGICAL) ×1 IMPLANT
EVACUATOR SILICONE 100CC (DRAIN) IMPLANT
FILTER STRAW FLUID ASPIR (MISCELLANEOUS) ×2 IMPLANT
GAUZE SPONGE 4X4 16PLY XRAY LF (GAUZE/BANDAGES/DRESSINGS) ×4 IMPLANT
GLOVE BIO SURGEON STRL SZ7 (GLOVE) ×2 IMPLANT
GLOVE BIO SURGEON STRL SZ8 (GLOVE) ×3 IMPLANT
GLOVE BIOGEL PI IND STRL 7.0 (GLOVE) ×1 IMPLANT
GLOVE BIOGEL PI IND STRL 8 (GLOVE) ×1 IMPLANT
GLOVE BIOGEL PI INDICATOR 7.0 (GLOVE) ×1
GLOVE BIOGEL PI INDICATOR 8 (GLOVE) ×2
GOWN PREVENTION PLUS LG XLONG (DISPOSABLE) ×3 IMPLANT
GOWN STRL NON-REIN LRG LVL3 (GOWN DISPOSABLE) ×4 IMPLANT
IV CATH 14GX2 1/4 (CATHETERS) ×2 IMPLANT
KIT BASIN OR (CUSTOM PROCEDURE TRAY) ×2 IMPLANT
KIT ROOM TURNOVER OR (KITS) ×2 IMPLANT
NDL 18GX1X1/2 (RX/OR ONLY) (NEEDLE) ×1 IMPLANT
NDL HYPO 25GX1X1/2 BEV (NEEDLE) ×1 IMPLANT
NDL SPNL 18GX3.5 QUINCKE PK (NEEDLE) ×2 IMPLANT
NEEDLE 18GX1X1/2 (RX/OR ONLY) (NEEDLE) ×2 IMPLANT
NEEDLE 22X1 1/2 (OR ONLY) (NEEDLE) IMPLANT
NEEDLE HYPO 25GX1X1/2 BEV (NEEDLE) ×2 IMPLANT
NEEDLE SPNL 18GX3.5 QUINCKE PK (NEEDLE) ×4 IMPLANT
NS IRRIG 1000ML POUR BTL (IV SOLUTION) ×2 IMPLANT
PACK LAMINECTOMY ORTHO (CUSTOM PROCEDURE TRAY) ×2 IMPLANT
PACK UNIVERSAL I (CUSTOM PROCEDURE TRAY) ×2 IMPLANT
PAD ARMBOARD 7.5X6 YLW CONV (MISCELLANEOUS) ×4 IMPLANT
PATTIES SURGICAL .5 X.5 (GAUZE/BANDAGES/DRESSINGS) ×1 IMPLANT
PATTIES SURGICAL .5 X1 (DISPOSABLE) ×2 IMPLANT
SPONGE GAUZE 4X4 12PLY (GAUZE/BANDAGES/DRESSINGS) ×2 IMPLANT
SPONGE SURGIFOAM ABS GEL 100 (HEMOSTASIS) ×2 IMPLANT
STRIP CLOSURE SKIN 1/2X4 (GAUZE/BANDAGES/DRESSINGS) IMPLANT
SURGIFLO TRUKIT (HEMOSTASIS) ×1 IMPLANT
SUT MNCRL AB 4-0 PS2 18 (SUTURE) ×2 IMPLANT
SUT VIC AB 0 CT1 18XCR BRD 8 (SUTURE) IMPLANT
SUT VIC AB 0 CT1 27 (SUTURE)
SUT VIC AB 0 CT1 27XBRD ANBCTR (SUTURE) IMPLANT
SUT VIC AB 0 CT1 8-18 (SUTURE)
SUT VIC AB 1 CT1 18XCR BRD 8 (SUTURE) ×1 IMPLANT
SUT VIC AB 1 CT1 8-18 (SUTURE) ×2
SUT VIC AB 2-0 CT2 18 VCP726D (SUTURE) ×3 IMPLANT
SYR 20CC LL (SYRINGE) IMPLANT
SYR 50ML SLIP (SYRINGE) ×1 IMPLANT
SYR BULB IRRIGATION 50ML (SYRINGE) ×2 IMPLANT
SYR CONTROL 10ML LL (SYRINGE) ×4 IMPLANT
SYR TB 1ML 26GX3/8 SAFETY (SYRINGE) ×4 IMPLANT
SYR TB 1ML LUER SLIP (SYRINGE) ×4 IMPLANT
TAPE CLOTH SURG 4X10 WHT LF (GAUZE/BANDAGES/DRESSINGS) ×1 IMPLANT
TOWEL OR 17X24 6PK STRL BLUE (TOWEL DISPOSABLE) ×2 IMPLANT
TOWEL OR 17X26 10 PK STRL BLUE (TOWEL DISPOSABLE) ×2 IMPLANT
WATER STERILE IRR 1000ML POUR (IV SOLUTION) ×2 IMPLANT
YANKAUER SUCT BULB TIP NO VENT (SUCTIONS) ×2 IMPLANT

## 2012-11-03 NOTE — Anesthesia Procedure Notes (Signed)
Procedure Name: Intubation Date/Time: 11/03/2012 7:33 AM Performed by: Tyrone Nine Pre-anesthesia Checklist: Patient identified, Timeout performed, Emergency Drugs available, Suction available and Patient being monitored Patient Re-evaluated:Patient Re-evaluated prior to inductionOxygen Delivery Method: Circle system utilized Preoxygenation: Pre-oxygenation with 100% oxygen Intubation Type: IV induction Ventilation: Mask ventilation without difficulty Laryngoscope Size: Mac and 3 Grade View: Grade I Tube type: Oral Tube size: 7.5 mm Number of attempts: 1 Airway Equipment and Method: Stylet Placement Confirmation: ETT inserted through vocal cords under direct vision,  breath sounds checked- equal and bilateral and positive ETCO2 Secured at: 21 cm Tube secured with: Tape Dental Injury: Teeth and Oropharynx as per pre-operative assessment

## 2012-11-03 NOTE — H&P (Signed)
PREOPERATIVE H&P  Chief Complaint: right leg pain  HPI: Sheena Young is a 37 y.o. female who presents with h/o right leg pain. Has failed conservative care.  Past Medical History  Diagnosis Date  . Gastritis, acute   . Gastritis   . History of colon polyps   . Insomnia   . History of migraine     last one about 6months  . Headache     occaionally  . Chronic back pain   . Joint pain   . Joint swelling   . Vomiting   . GERD (gastroesophageal reflux disease)     takes Zantac prn  . Overactive bladder     never filled prescription bc unable to afford  . Urinary urgency   . Urinary frequency   . Anxiety     but doesn't take any meds for this   Past Surgical History  Procedure Laterality Date  . Paradontal surgery    . Ectopic pregnency    . Colonoscopy     History   Social History  . Marital Status: Divorced    Spouse Name: N/A    Number of Children: N/A  . Years of Education: N/A   Social History Main Topics  . Smoking status: Current Every Day Smoker -- 0.25 packs/day for 13 years  . Smokeless tobacco: None  . Alcohol Use: 1.2 oz/week    2 Glasses of wine per week     Comment: occasionally  . Drug Use: Yes    Special: Marijuana     Comment: for 2yrs;last time this morning  . Sexually Active: Yes   Other Topics Concern  . None   Social History Narrative  . None   History reviewed. No pertinent family history. Allergies  Allergen Reactions  . Oxycodone    Prior to Admission medications   Medication Sig Start Date End Date Taking? Authorizing Provider  cyclobenzaprine (FLEXERIL) 5 MG tablet Take 5 mg by mouth 2 (two) times daily with a meal.   Yes Historical Provider, MD  HYDROcodone-acetaminophen (NORCO/VICODIN) 5-325 MG per tablet Take 1-2 tablets by mouth every 6 (six) hours as needed for pain.   Yes Historical Provider, MD  meloxicam (MOBIC) 15 MG tablet Take 15 mg by mouth daily.   Yes Historical Provider, MD  naproxen sodium (ANAPROX) 220  MG tablet Take 220 mg by mouth 2 (two) times daily with a meal.   Yes Historical Provider, MD     All other systems have been reviewed and were otherwise negative with the exception of those mentioned in the HPI and as above.  Physical Exam: Filed Vitals:   11/03/12 0600  BP: 124/74  Pulse: 77  Temp: 98.6 F (37 C)  Resp: 20    General: Alert, no acute distress Cardiovascular: No pedal edema Respiratory: No cyanosis, no use of accessory musculature Skin: No lesions in the area of chief complaint Neurologic: Sensation intact distally Psychiatric: Patient is competent for consent with normal mood and affect Lymphatic: No axillary or cervical lymphadenopathy  MUSCULOSKELETAL: + SLR on right  Assessment/Plan: Low back pain with right leg pain Plan for Procedure(s): LUMBAR LAMINECTOMY/DECOMPRESSION MICRODISCECTOMY  Right sided lumbar 4-5 microdisectomy   Emilee Hero, MD 11/03/2012 6:34 AM

## 2012-11-03 NOTE — Anesthesia Preprocedure Evaluation (Addendum)
Anesthesia Evaluation  Patient identified by MRN, date of birth, ID band Patient awake    Reviewed: Allergy & Precautions, H&P , NPO status , Patient's Chart, lab work & pertinent test results  Airway Mallampati: I TM Distance: >3 FB Neck ROM: Full    Dental  (+) Teeth Intact and Dental Advisory Given   Pulmonary COPDCurrent Smoker,  11-01-12 Chest X-ray Findings: Normal heart size, mediastinal contours, and pulmonary vascularity. Lungs clear. No pleural effusion or pneumothorax. No acute osseous findings.   IMPRESSION: No acute abnormalities.    + rhonchi   + wheezing      Cardiovascular Exercise Tolerance: Good Rhythm:Regular Rate:Normal     Neuro/Psych  Headaches, Anxiety 4-Findings: Transitional anatomy.  L5 is partially sacralized on the left.  Rudimentary L5-S1 interspace.  Assimilation joint between the enlarged left transverse process and the sacrum.   Lateral flexion/extension views demonstrate no abnormal movement.   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  There is no dynamic instability.     22-14 Lumbar spine   Neuromuscular disease    GI/Hepatic GERD-  Medicated,(+)     substance abuse  marijuana use,   Endo/Other  negative endocrine ROS  Renal/GU negative Renal ROS     Musculoskeletal  (+) Arthritis -, Osteoarthritis,    Abdominal   Peds negative pediatric ROS (+)  Hematology negative hematology ROS (+)   Anesthesia Other Findings Smoker + cough  Reproductive/Obstetrics                      Anesthesia Physical Anesthesia Plan  ASA: II  Anesthesia Plan: General   Post-op Pain Management:    Induction: Intravenous  Airway Management Planned: Oral ETT  Additional Equipment:   Intra-op Plan:   Post-operative Plan: Extubation in OR  Informed Consent: I have reviewed the patients  History and Physical, chart, labs and discussed the procedure including the risks, benefits and alternatives for the proposed anesthesia with the patient or authorized representative who has indicated his/her understanding and acceptance.   Dental advisory given  Plan Discussed with: CRNA, Surgeon and Anesthesiologist  Anesthesia Plan Comments:        Anesthesia Quick Evaluation

## 2012-11-03 NOTE — Transfer of Care (Signed)
Immediate Anesthesia Transfer of Care Note  Patient: Sheena Young  Procedure(s) Performed: Procedure(s) with comments: LUMBAR LAMINECTOMY/DECOMPRESSION MICRODISCECTOMY  Right sided lumbar 4-5 microdisectomy (Right) - Right sided lumbar 4-5 microdisectomy  Patient Location: PACU  Anesthesia Type:General  Level of Consciousness: awake, alert , oriented, sedated and patient cooperative  Airway & Oxygen Therapy: Patient Spontanous Breathing and Patient connected to nasal cannula oxygen  Post-op Assessment: Report given to PACU RN and Post -op Vital signs reviewed and stable  Post vital signs: Reviewed and stable  Complications: No apparent anesthesia complications

## 2012-11-03 NOTE — Progress Notes (Signed)
Pt states her pain is constant on a daily basis.  Wants to go home

## 2012-11-03 NOTE — Anesthesia Postprocedure Evaluation (Signed)
  Anesthesia Post-op Note  Patient: Sheena Young  Procedure(s) Performed: Procedure(s) with comments: LUMBAR LAMINECTOMY/DECOMPRESSION MICRODISCECTOMY  Right sided lumbar 4-5 microdisectomy (Right) - Right sided lumbar 4-5 microdisectomy  Patient Location: PACU  Anesthesia Type:General  Level of Consciousness: awake  Airway and Oxygen Therapy: Patient Spontanous Breathing  Post-op Pain: mild  Post-op Assessment: Post-op Vital signs reviewed  Post-op Vital Signs: Reviewed  Complications: No apparent anesthesia complications

## 2012-11-03 NOTE — Preoperative (Signed)
Beta Blockers   Reason not to administer Beta Blockers:Not Applicable 

## 2012-11-03 NOTE — Op Note (Signed)
Sheena Young, Sheena Young             ACCOUNT NO.:  [id number]  MEDICAL RECORD NO.:  [id number]  LOCATION:  [facility]                         FACILITY:  [facility]  PHYSICIAN:  Estill Bamberg, MD      DATE OF BIRTH:  1976-03-14  DATE OF PROCEDURE:  11/03/2012 DATE OF DISCHARGE:  11/03/2012                              OPERATIVE REPORT   PREOPERATIVE DIAGNOSIS: 1. Right-sided L5 radiculopathy. 2. 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  POSTOPERATIVE DIAGNOSIS: 1. Right-sided L5 radiculopathy. 2. 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  PROCEDURE:  Right-sided L4-5 microdiskectomy.  SURGEON:  Estill Bamberg, MD  ASSISTANT:  Jason Coop, PA-C  ANESTHESIA:  General endotracheal anesthesia.  COMPLICATIONS:  None.  DISPOSITION:  Stable.  ESTIMATED BLOOD LOSS:  Minimal.  INDICATIONS FOR PROCEDURE:  Briefly, Sheena Young is a very pleasant 37- year-old female, who I did see with a long-standing history of chronic pain in her low back.  She also had a 1 year history of pain in her right leg.  I did review an MRI, which was notable for a disk herniation compromise in the lateral recess on the right side.  I did feel that this was resulting in right-sided L5 radiculopathy.  She did fail multiple forms of conservative care and she did therefore elect to go forward the right-sided L4-5 microdiskectomy.  The patient currently understood the risks and limitations of the procedure as outlined in my preoperative note.  Of particular note, the patient did understand that her leg pain had been present for 1 year, and that residual symptoms may resolve after her decompressive procedure.  OPERATIVE DETAILS:  On November 03, 2012, the patient was brought to surgery and general endotracheal anesthesia was administered.  The patient was placed prone on a well-padded flat Jackson bed with Wilson frame.  Antibiotics were given and a time-out procedure was performed. I then made a midline incision over the  L4-5 interspace.  A lateral intraoperative radiograph did help me localize the appropriate operative level.  I then used a high-speed bur to remove the inferomedial aspect of the L4 lamina.  The ligamentum flavum was identified and taken down. The traversing L5 nerve root was noted to be under undue pressure.  The nerve roots were retracted medially and a broad-based prominent disk herniation was readily evident.  I did tease where the superficial layers overlying the disk herniation, however, a specific disk fragment did not clear itself.  I, therefore, did use a nerve hook to enter through the anulus and I did use a micropituitary and micro up-biting pituitary to remove the central and paracentral herniated disk noted on the MRI.  At the termination of this portion of the procedure, there was no undue pressure on the traversing L5 nerve.  Epidural bleeding was controlled using bipolar electrocautery with addition of Surgiflo.  I did use a normal saline flush to irrigate the intervertebral space, to ensure that there are no remaining disk fragments.  All bleeding was controlled and 40 mg of Depo-Medrol was infiltrated about the epidural space.  I then closed the fascia using #1 Vicryl.  The subcutaneous layer was closed using 2-0 Vicryl.  The skin was closed using 3-0 Monocryl.  Benzoin and Steri-Strips were applied followed by sterile dressing.  Of note, Jason Coop was my assistant throughout the entirety of the procedure and aided in essential retraction and suctioning required throughout the surgery.     Estill Bamberg, MD     MD/MEDQ  D:  11/03/2012  T:  11/03/2012  Job:  [id number]

## 2012-11-04 ENCOUNTER — Other Ambulatory Visit (HOSPITAL_COMMUNITY): Payer: Self-pay

## 2012-11-04 ENCOUNTER — Encounter (HOSPITAL_COMMUNITY): Payer: Self-pay | Admitting: Orthopedic Surgery

## 2012-12-14 ENCOUNTER — Other Ambulatory Visit (HOSPITAL_COMMUNITY): Payer: Self-pay | Admitting: Orthopedic Surgery

## 2012-12-14 ENCOUNTER — Ambulatory Visit (HOSPITAL_COMMUNITY)
Admission: RE | Admit: 2012-12-14 | Discharge: 2012-12-14 | Disposition: A | Discharge status: Home / Self Care | Payer: BC Managed Care – PPO | Source: Ambulatory Visit | Attending: Orthopedic Surgery | Admitting: Orthopedic Surgery

## 2012-12-14 DIAGNOSIS — M7989 Other specified soft tissue disorders: Secondary | ICD-10-CM

## 2012-12-14 DIAGNOSIS — M79609 Pain in unspecified limb: Secondary | ICD-10-CM | POA: Insufficient documentation

## 2012-12-14 DIAGNOSIS — M25561 Pain in right knee: Secondary | ICD-10-CM

## 2012-12-14 NOTE — Progress Notes (Signed)
VASCULAR LAB PRELIMINARY  PRELIMINARY  PRELIMINARY  PRELIMINARY  Right lower extremity venous duplex completed.    Preliminary report:  Right:  No evidence of DVT, superficial thrombosis, or Baker's cyst.  Sheena Young, RVT 12/14/2012, 3:15 PM

## 2013-05-01 ENCOUNTER — Encounter: Payer: Self-pay | Admitting: Gastroenterology

## 2013-06-01 ENCOUNTER — Ambulatory Visit (INDEPENDENT_AMBULATORY_CARE_PROVIDER_SITE_OTHER): Payer: BC Managed Care – PPO | Admitting: Gastroenterology

## 2013-06-01 ENCOUNTER — Encounter: Payer: Self-pay | Admitting: Gastroenterology

## 2013-06-01 VITALS — BP 98/62 | HR 76 | Ht 64.0 in | Wt 179.4 lb

## 2013-06-01 DIAGNOSIS — R140 Abdominal distension (gaseous): Secondary | ICD-10-CM

## 2013-06-01 DIAGNOSIS — R198 Other specified symptoms and signs involving the digestive system and abdomen: Secondary | ICD-10-CM

## 2013-06-01 DIAGNOSIS — K219 Gastro-esophageal reflux disease without esophagitis: Secondary | ICD-10-CM

## 2013-06-01 DIAGNOSIS — R141 Gas pain: Secondary | ICD-10-CM

## 2013-06-01 MED ORDER — PEG-KCL-NACL-NASULF-NA ASC-C 100 G PO SOLR: 1.0000 | Freq: Once | ORAL | Status: DC

## 2013-06-01 MED ORDER — GLYCOPYRROLATE 1 MG PO TABS: 1.0000 mg | ORAL_TABLET | Freq: Two times a day (BID) | ORAL | Status: DC

## 2013-06-01 MED ORDER — DEXLANSOPRAZOLE 60 MG PO CPDR: 60.0000 mg | DELAYED_RELEASE_CAPSULE | Freq: Every day | ORAL | Status: DC

## 2013-06-01 NOTE — Progress Notes (Addendum)
    History of Present Illness: This is a 37 year old female who relates bowel problems since childhood. For over 20 years she has had problems with constipation, severe gas, bloating and generalized abdominal discomfort. The symptoms have worsened over the past 1-2 years. She states she was followed by [facility] and underwent colonoscopy around 2006 or 2007. She has frequent reflux symptoms and over the past several weeks notes frequent morning vomiting. Prilosec has not been effective so she discontinued it. She is taking Gas-X to control her gas and bloating symptoms. Denies weight loss, diarrhea, change in stool caliber, melena, hematochezia, dysphagia, chest pain.  Review of Systems: Pertinent positive and negative review of systems were noted in the above HPI section. All other review of systems were otherwise negative.  Current Medications, Allergies, Past Medical History, Past Surgical History, Family History and Social History were reviewed in [employer] record.  Physical Exam: General: Well developed , well nourished, no acute distress Head: Normocephalic and atraumatic Eyes:  sclerae anicteric, EOMI Ears: Normal auditory acuity Mouth: No deformity or lesions Neck: Supple, no masses or thyromegaly Lungs: Clear throughout to auscultation Heart: Regular rate and rhythm; no murmurs, rubs or bruits Abdomen: Soft, non tender and non distended. No masses, hepatosplenomegaly or hernias noted. Normal Bowel sounds Musculoskeletal: Symmetrical with no gross deformities  Skin: No lesions on visible extremities Pulses:  Normal pulses noted Extremities: No clubbing, cyanosis, edema or deformities noted Neurological: Alert oriented x 4, grossly nonfocal Cervical Nodes:  No significant cervical adenopathy Inguinal Nodes: No significant inguinal adenopathy Psychological:  Alert and cooperative. Anxious.  Assessment and Recommendations:  1. Presumed irritable bowel  syndrome-constipation predominant. Begin Gas-X 4 times a day, low gas diet, FODMAP diet, daily probiotic and glycopyrrolate 1 mg twice a day. MiraLax once or twice daily titrated for adequate bowel movements Schedule colonoscopy. The risks, benefits, and alternatives to colonoscopy with possible biopsy and possible polypectomy were discussed with the patient and they consent to proceed.   2. GERD with frequent morning vomiting. Standard antireflux measures and Dexilant 60 mg daily. Schedule upper endoscopy. The risks, benefits, and alternatives to endoscopy with possible biopsy and possible dilation were discussed with the patient and they consent to proceed.   3. Strongly recommended that she obtain a PCP and a gynecologist for ongoing health care.  07/21/2013. Records are received and reviewed from Dr. Gari Crown. Colonoscopy 11/2005 was normal. Upper endoscopy 11/2005 mild gastric erythema with biopsy showing rare H. pylori.

## 2013-06-01 NOTE — Patient Instructions (Signed)
You have been given a Low gas diet and Fodmap diet.   Please start Dexilant samples one tablet by mouth once daily. A prescription has been sent to your pharmacy.   Patient advised to avoid spicy, acidic, citrus, chocolate, mints, fruit and fruit juices.  Limit the intake of caffeine, alcohol and Soda.  Don't exercise too soon after eating.  Don't lie down within 3-4 hours of eating.  Elevate the head of your bed.  We have sent the following medications to your pharmacy for you to pick up at your convenience: Robinul.  You have been scheduled for an endoscopy and colonoscopy with propofol. Please follow the written instructions given to you at your visit today. Please pick up your prep at the pharmacy within the next 1-3 days. If you use inhalers (even only as needed), please bring them with you on the day of your procedure.  Thank you for choosing me and [facility].  Venita Lick. Pleas Koch., MD., Clementeen Graham

## 2013-06-02 ENCOUNTER — Encounter: Payer: Self-pay | Admitting: Gastroenterology

## 2013-06-06 ENCOUNTER — Telehealth: Payer: Self-pay

## 2013-06-06 NOTE — Telephone Encounter (Signed)
Received faxed denial from [employer] on [place] done on 06/02/13 for Dexilant. Pt has not tried and failed preferred alternatives which are esomeprazole, lansoprazole, omeprazole, pantoprazole, omeprazole/sodium and rabeprazole. Called patient and left a message on home and mobile numbers but both number have another person's name on voicemail.

## 2013-06-12 NOTE — Telephone Encounter (Signed)
Left another message at work phone number which is a males voice on a cell phone voicemail stating that this is [place]. The home phone has now been disconnected. Called mobile phone number and the lady that answered states that this is not the right number for [place]. Will mail patient a letter.

## 2013-06-15 ENCOUNTER — Telehealth: Payer: Self-pay | Admitting: Gastroenterology

## 2013-06-15 NOTE — Telephone Encounter (Signed)
Called number and female that answered states that I have the wrong number. I verified with Toniann Fail that works in our office that when she  originally took the call from patient this is in fact the number patient verified with her on the phone as the correct number. Will wait for return call again from patient since we do still do not have a correct phone number.

## 2013-07-12 ENCOUNTER — Telehealth: Payer: Self-pay | Admitting: Radiology

## 2013-07-12 ENCOUNTER — Ambulatory Visit (INDEPENDENT_AMBULATORY_CARE_PROVIDER_SITE_OTHER): Payer: BC Managed Care – PPO | Admitting: Family Medicine

## 2013-07-12 ENCOUNTER — Ambulatory Visit: Payer: BC Managed Care – PPO

## 2013-07-12 ENCOUNTER — Other Ambulatory Visit: Payer: Self-pay | Admitting: Family Medicine

## 2013-07-12 VITALS — BP 100/60 | HR 81 | Temp 98.6°F | Resp 18 | Ht 64.0 in | Wt 182.0 lb

## 2013-07-12 DIAGNOSIS — D72829 Elevated white blood cell count, unspecified: Secondary | ICD-10-CM

## 2013-07-12 DIAGNOSIS — R112 Nausea with vomiting, unspecified: Secondary | ICD-10-CM

## 2013-07-12 DIAGNOSIS — J209 Acute bronchitis, unspecified: Secondary | ICD-10-CM

## 2013-07-12 DIAGNOSIS — R1031 Right lower quadrant pain: Secondary | ICD-10-CM

## 2013-07-12 DIAGNOSIS — G8929 Other chronic pain: Secondary | ICD-10-CM

## 2013-07-12 DIAGNOSIS — R059 Cough, unspecified: Secondary | ICD-10-CM

## 2013-07-12 DIAGNOSIS — R109 Unspecified abdominal pain: Secondary | ICD-10-CM

## 2013-07-12 DIAGNOSIS — R05 Cough: Secondary | ICD-10-CM

## 2013-07-12 LAB — POCT CBC
Granulocyte percent: 77.6 %G (ref 37–80)
Lymph, poc: 3 (ref 0.6–3.4)
MCHC: 31.1 g/dL — AB (ref 31.8–35.4)
MCV: 102.2 fL — AB (ref 80–97)
MID (cbc): 1 — AB (ref 0–0.9)
MPV: 8.7 fL (ref 0–99.8)
POC Granulocyte: 13.7 — AB (ref 2–6.9)
POC LYMPH PERCENT: 17 %L (ref 10–50)
POC MID %: 5.4 %M (ref 0–12)
Platelet Count, POC: 318 10*3/uL (ref 142–424)
RBC: 4.37 M/uL (ref 4.04–5.48)
RDW, POC: 13.4 %

## 2013-07-12 LAB — POCT UA - MICROSCOPIC ONLY
Crystals, Ur, HPF, POC: NEGATIVE
Yeast, UA: NEGATIVE

## 2013-07-12 LAB — POCT URINE PREGNANCY: Preg Test, Ur: NEGATIVE

## 2013-07-12 LAB — COMPLETE METABOLIC PANEL WITH GFR
AST: 20 U/L (ref 0–37)
Albumin: 4.3 g/dL (ref 3.5–5.2)
Alkaline Phosphatase: 57 U/L (ref 39–117)
BUN: 10 mg/dL (ref 6–23)
Chloride: 102 mEq/L (ref 96–112)
GFR, Est Non African American: >89 mL/min
Potassium: 4.2 mEq/L (ref 3.5–5.3)

## 2013-07-12 LAB — LIPASE: Lipase: <10 U/L (ref 0–75)

## 2013-07-12 LAB — POCT URINALYSIS DIPSTICK
Bilirubin, UA: NEGATIVE
Glucose, UA: NEGATIVE
Leukocytes, UA: NEGATIVE
Nitrite, UA: NEGATIVE
Spec Grav, UA: 1.02
pH, UA: 8

## 2013-07-12 MED ORDER — HYDROCODONE-HOMATROPINE 5-1.5 MG/5ML PO SYRP: 5.0000 mL | ORAL_SOLUTION | ORAL | Status: DC | PRN

## 2013-07-12 MED ORDER — ONDANSETRON 4 MG PO TBDP: ORAL_TABLET | ORAL | Status: DC

## 2013-07-12 MED ORDER — AMOXICILLIN 875 MG PO TABS: 875.0000 mg | ORAL_TABLET | Freq: Two times a day (BID) | ORAL | Status: DC

## 2013-07-12 NOTE — Patient Instructions (Signed)
Get the gallbladder ultrasound.  If it is normal take the amoxicillin one twice daily  Cough syrup as needed  Nausea medicine as needed  To the emergency room if in all worse  If not much better in the next 48 hours she should return for recheck

## 2013-07-12 NOTE — Progress Notes (Signed)
Subjective: 37 year old lady with history of being ill with a vomiting problem over the last 2 weeks. 3 weeks ago she vomited 2 or 3 times a day for a while. She is due a little bit better, then over the past week it's recurred and gotten much worse with about 6 times vomiting this morning. She has mild low abdominal pain and some right lower quadrant pain primarily. The right lower quadrant pain has been there for some time apparently. No fever. No dysuria. She has had a cough. She smokes occasionall, which turns out to be about one half pack cigarettes a day. She generally feels bad. Last menstrual cycle was reportedly about 2 weeks ago.  Objective: Overweight lady in no major acute distress. Her TMs are normal the right, old perforation on the left. Throat clear. Neck supple without nodes thyromegaly. Chest has a slight rale in the left lower lobe. Heart regular without murmurs. Abdomen normal bowel sounds. Soft without masses. Has some tenderness in the right upper part get in in the right lower quadrant. No rebound.  Assessment: Abdominal pain Vomiting Cough  Plan: 3 view abdomen including chest UA, CBC, urine hCG  Results for orders placed in visit on 07/12/13  POCT CBC      Result Value Range   WBC 17.6 (*) 4.6 - 10.2 K/uL   Lymph, poc 3.0  0.6 - 3.4   POC LYMPH PERCENT 17.0  10 - 50 %L   MID (cbc) 1.0 (*) 0 - 0.9   POC MID % 5.4  0 - 12 %M   POC Granulocyte 13.7 (*) 2 - 6.9   Granulocyte percent 77.6  37 - 80 %G   RBC 4.37  4.04 - 5.48 M/uL   Hemoglobin 13.9  12.2 - 16.2 g/dL   HCT, POC 16.1  09.6 - 47.9 %   MCV 102.2 (*) 80 - 97 fL   MCH, POC 31.8 (*) 27 - 31.2 pg   MCHC 31.1 (*) 31.8 - 35.4 g/dL   RDW, POC 04.5     Platelet Count, POC 318  142 - 424 K/uL   MPV 8.7  0 - 99.8 fL  POCT UA - MICROSCOPIC ONLY      Result Value Range   WBC, Ur, HPF, POC 0-3     RBC, urine, microscopic 0-3     Bacteria, U Microscopic 2+     Mucus, UA neg     Epithelial cells, urine per  micros 4-8     Crystals, Ur, HPF, POC neg     Casts, Ur, LPF, POC neg     Yeast, UA neg    POCT URINALYSIS DIPSTICK      Result Value Range   Color, UA yellow     Clarity, UA clear     Glucose, UA neg     Bilirubin, UA neg     Ketones, UA neg     Spec Grav, UA 1.020     Blood, UA neg     pH, UA 8.0     Protein, UA 100     Urobilinogen, UA 1.0     Nitrite, UA neg     Leukocytes, UA Negative    POCT URINE PREGNANCY      Result Value Range   Preg Test, Ur Negative     [facility] reading (PRIMARY) by  Dr. Alwyn Ren Abdominal x-ray normal except for some stool in the colon.  We examined the patient. She still has mild right upper  quadrant tenderness, but it's not bad. Bowel sounds are normal.  Assessment: Vomiting Abdominal pain Bronchitis Leukocytosis  Plan: Will go ahead and get a stat ultrasound of the abdomen if possible today. Send her labs for a stat C. met and lipase. Treat her for bronchitis. If she gets a normal report on the gallbladder she will be sent on home and treated for bronchitis. If she is found to have gallbladder disease she would be sent to the emergency room. She was told to go back to GERD any time if abruptly worse. .  She has been intolerant of oxycodone, but has taken hydrocodone safely in the past.  Unable to get the ultrasound today. It will be scheduled for day after tomorrow. If she gets abruptly worse at all she is going to the emergency room.

## 2013-07-12 NOTE — Telephone Encounter (Signed)
Patients labs came back CMET lipase normal

## 2013-07-14 ENCOUNTER — Ambulatory Visit (HOSPITAL_COMMUNITY)
Admission: RE | Admit: 2013-07-14 | Discharge: 2013-07-14 | Disposition: A | Discharge status: Home / Self Care | Payer: BC Managed Care – PPO | Source: Ambulatory Visit | Attending: Family Medicine | Admitting: Family Medicine

## 2013-07-14 DIAGNOSIS — G8929 Other chronic pain: Secondary | ICD-10-CM

## 2013-07-14 DIAGNOSIS — D72829 Elevated white blood cell count, unspecified: Secondary | ICD-10-CM

## 2013-07-14 DIAGNOSIS — K8020 Calculus of gallbladder without cholecystitis without obstruction: Secondary | ICD-10-CM | POA: Insufficient documentation

## 2013-07-14 DIAGNOSIS — R1031 Right lower quadrant pain: Secondary | ICD-10-CM

## 2013-07-14 DIAGNOSIS — R112 Nausea with vomiting, unspecified: Secondary | ICD-10-CM

## 2013-07-15 ENCOUNTER — Telehealth: Payer: Self-pay

## 2013-07-15 NOTE — Telephone Encounter (Signed)
Call patient: I think I had already sent a message to have her called.  The U/S was normal.  If symptoms are persisting have her return for a recheck.

## 2013-07-15 NOTE — Telephone Encounter (Signed)
Gave pt results.  Pt states that she is still having pain in her abdomen and coughing.  I advised her that she per Dr. Linna Darner should return for recheck.

## 2013-07-15 NOTE — Telephone Encounter (Signed)
PATIENT STATES SHE WAS IN THE OFFICE TO SEE DR. HOPPER ON Wednesday. HE SENT HER TO HAVE A ULTRASOUND OF HER GALL BLADDER. SHE HAD IT DONE Friday AT [facility]. SHE WAS TOLD SHE WOULD GET A CALL BACK ON THE RESULTS THE SAME DAY. SHE WOULD LIKE TO GET THOSE RESULTS SINCE DR. HOPPER THOUGHT SHE MAY HAVE A RUPTURED GALL BLADDER. BEST PHONE [phone] (CELL)   PHARMACY CHOICE IS [facility] ON [address].   [place]

## 2013-07-15 NOTE — Telephone Encounter (Signed)
Noted  

## 2013-07-18 NOTE — Telephone Encounter (Signed)
Noted.  She knows to return if needed.

## 2013-07-20 ENCOUNTER — Telehealth: Payer: Self-pay | Admitting: Family Medicine

## 2013-07-20 NOTE — Telephone Encounter (Signed)
Informed pt of normal u/s. Pt stated that she was having trouble breathing and I informed her to go to the ER or come to the urgent care. She wanted to know when Dr. Linna Darner would be in. I let her know that she could see anyone and it was urgent for her to be seen if she was having SOB. She understood.

## 2013-07-20 NOTE — Telephone Encounter (Signed)
Message copied by Chinita Pester on Thu Jul 20, 2013  3:51 PM ------      Message from: HOPPER, DAVID H      Created: Sat Jul 15, 2013  8:42 AM       Call patient:      Ultrasound was normal.  If symptoms continue return for recheck ------

## 2013-07-20 NOTE — Telephone Encounter (Signed)
Message copied by Chinita Pester on Thu Jul 20, 2013  3:48 PM ------      Message from: HOPPER, DAVID H      Created: Sat Jul 15, 2013  8:42 AM       Call patient:      Ultrasound was normal.  If symptoms continue return for recheck ------

## 2013-07-21 ENCOUNTER — Telehealth: Payer: Self-pay | Admitting: Family Medicine

## 2013-07-21 NOTE — Telephone Encounter (Signed)
Pt has an appt. Jan 20.

## 2013-07-27 ENCOUNTER — Telehealth: Payer: Self-pay | Admitting: Gastroenterology

## 2013-07-27 NOTE — Telephone Encounter (Signed)
Recd records from [facility]., Forwarding 6pgs to Dr.Stark

## 2013-08-01 ENCOUNTER — Telehealth: Payer: Self-pay | Admitting: Gastroenterology

## 2013-08-01 ENCOUNTER — Encounter: Payer: BC Managed Care – PPO | Admitting: Gastroenterology

## 2013-08-01 NOTE — Telephone Encounter (Signed)
Noted  

## 2013-12-22 ENCOUNTER — Encounter: Payer: Self-pay | Admitting: Family Medicine

## 2013-12-22 ENCOUNTER — Ambulatory Visit (INDEPENDENT_AMBULATORY_CARE_PROVIDER_SITE_OTHER): Payer: 59

## 2013-12-22 ENCOUNTER — Ambulatory Visit (INDEPENDENT_AMBULATORY_CARE_PROVIDER_SITE_OTHER): Payer: 59 | Admitting: Family Medicine

## 2013-12-22 VITALS — BP 100/62 | HR 68 | Temp 97.7°F | Resp 16

## 2013-12-22 DIAGNOSIS — M25571 Pain in right ankle and joints of right foot: Secondary | ICD-10-CM

## 2013-12-22 DIAGNOSIS — M25579 Pain in unspecified ankle and joints of unspecified foot: Secondary | ICD-10-CM

## 2013-12-22 LAB — POCT URINE PREGNANCY: PREG TEST UR: NEGATIVE

## 2013-12-22 MED ORDER — HYDROCODONE-ACETAMINOPHEN 5-325 MG PO TABS: 1.0000 | ORAL_TABLET | Freq: Three times a day (TID) | ORAL | Status: DC | PRN

## 2013-12-22 NOTE — Patient Instructions (Signed)
Use the crutches and ankle support as needed.  Use the hydrocodone as needed for pain- remember this can make you sleepy especially when used with flexeril and/or neurontin.  We will get you referred to [facility] regarding your ankle problem.  Let me know if you are getting worse in the meantime or have any other concerns.  Ice and elevate your ankle when possible.  Try to ice about 3 x a day, 20 minutes each

## 2013-12-22 NOTE — Progress Notes (Signed)
[facility] [address] [place] [address] [phone]  Date:  12/22/2013   Name:  Sheena Young   DOB:  Dec 08, 1975   MRN:  [id number]  PCP:  No PCP Per Patient    Chief Complaint: Foot Pain   History of Present Illness:  Sheena Young is a 38 y.o. very pleasant female patient who presents with the following:  She is here today with a right ankle problem.  She broke this ankle severely as a child.  She did not need surgery.  She also fractured the ankle about two years ago- distal fibular avulsion.  She reports that this did not heal well.  Yesterday she was just sitting on the couch for a while; when she got up to try and walk on her ankle and had sudden onset of pain. She is not aware of any injury this time.  She is not aware of any unusual activity, no extra exercise.  No other joint pains.    Her LMP was about one month ago- she is due for a period soon.  No contraception as she and her husband would like to conceive.   There are no active problems to display for this patient.   Past Medical History  Diagnosis Date  . Gastritis, acute   . Gastritis   . Insomnia   . History of migraine     last one about 67months  . Headache(784.0)     occaionally  . Chronic back pain   . Joint pain   . Joint swelling   . Vomiting   . GERD (gastroesophageal reflux disease)     takes Zantac prn  . Overactive bladder     never filled prescription bc unable to afford  . Urinary urgency   . Urinary frequency   . Anxiety     but doesn't take any meds for this    Past Surgical History  Procedure Laterality Date  . Peridontal surgery    . Ectopic pregnancy surgery    . Colonoscopy    . Lumbar laminectomy/decompression microdiscectomy Right 11/03/2012    Procedure: LUMBAR LAMINECTOMY/DECOMPRESSION MICRODISCECTOMY  Right sided lumbar 4-5 microdisectomy;  Surgeon: Sinclair Ship, MD;  Location: [facility];  Service: Orthopedics;  Laterality: Right;   Right sided lumbar 4-5 microdisectomy    History  Substance Use Topics  . Smoking status: Former Smoker -- 0.25 packs/day for 13 years    Types: E-cigarettes  . Smokeless tobacco: Never Used     Comment: of electronic cigarette  . Alcohol Use: 1.2 oz/week    2 Glasses of wine per week     Comment: occasionally    Family History  Problem Relation Age of Onset  . Colon polyps Neg Hx   . Colon cancer Neg Hx   . Cervical cancer Sister   . Diabetes Maternal Grandmother   . Diabetes Maternal Aunt     x 3  . Diabetes Maternal Uncle     x 3  . Congestive Heart Failure Maternal Grandmother   . Heart attack Paternal Grandfather   . Heart disease Maternal Uncle   . Liver disease Neg Hx   . Kidney disease Neg Hx     Allergies  Allergen Reactions  . Oxycodone     Medication list has been reviewed and updated.  Current Outpatient Prescriptions on File Prior to Visit  Medication Sig Dispense Refill  . cyclobenzaprine (FLEXERIL) 5 MG tablet Take 5 mg  by mouth 2 (two) times daily as needed for muscle spasms.      Marland Kitchen gabapentin (NEURONTIN) 100 MG capsule Take 100 mg by mouth 3 (three) times daily.      . Simethicone (GAS-X PO) Take by mouth as needed.       No current facility-administered medications on file prior to visit.    Review of Systems:  As per HPI- otherwise negative.   Physical Examination: Filed Vitals:   12/22/13 1105  BP: 100/62  Pulse: 68  Temp: 97.7 F (36.5 C)  Resp: 16   There were no vitals filed for this visit. There is no weight on file to calculate BMI. Ideal Body Weight:    GEN: WDWN, NAD, Non-toxic, A & O x 3, overweight, looks well HEENT: Atraumatic, Normocephalic. Neck supple. No masses, No LAD. Ears and Nose: No external deformity. CV: RRR, No M/G/R. No JVD. No thrill. No extra heart sounds. PULM: CTA B, no wheezes, crackles, rhonchi. No retractions. No resp. distress. No accessory muscle use. ABD: S, NT, ND, +BS. No rebound. No  HSM. EXTR: No c/c/e NEURO hopping to keep weight off her ankle PSYCH: Normally interactive. Conversant. Not depressed or anxious appearing.  Calm demeanor.  Right ankle: she is tender over the anterior, medial and lateral ankle.  Achilles is intact, and there is no tenderness in the foot or over any over the MT bones.  She does have pain with flexion of the ankle.    [facility] reading (PRIMARY) by  Dr. Lorelei Pont. Right ankle: possible avulsion off the talus  RIGHT ANKLE - COMPLETE 3+ VIEW  COMPARISON: Dec 08, 2011  FINDINGS:  Frontal, oblique, and lateral views were obtained. There is evidence  of old trauma along the lateral malleolus, stable. A small  calcification slightly superior to the lateral talus is stable  compared to prior study. These calcifications are felt to be due to  previous trauma. There is no acute fracture or effusion. The ankle  mortise appears intact. There is a small bone island in the  posterior distal tibia.  IMPRESSION:  Evidence of a prior trauma slightly superior to the talus laterally  as well as in the lateral malleolar regions. No acute fracture or  effusion. Ankle mortise appears intact.   Results for orders placed in visit on 12/22/13  POCT URINE PREGNANCY      Result Value Ref Range   Preg Test, Ur Negative     Assessment and Plan: Pain in right ankle - Plan: POCT urine pregnancy, DG Ankle Complete Right, Ambulatory referral to Orthopedic Surgery, HYDROcodone-acetaminophen (NORCO/VICODIN) 5-325 MG per tablet  Ankle pain without any apparent injury.  She may just have stiffness due to old fractures.  Placed in a sweed-o and given crutches. She has a cam at home that she can use prn.  She is quite uncomfortable so did give a small supply of vicodin to use prn. Per her report she is able to take vicodin without difficulty, and indeed she had an rx for hycodan in December.  Referral to [facility] for her recurrent ankle issues.   See patient instructions  for more details.     Signed Lamar Blinks, MD

## 2014-01-24 ENCOUNTER — Ambulatory Visit (INDEPENDENT_AMBULATORY_CARE_PROVIDER_SITE_OTHER): Payer: 59 | Admitting: Physician Assistant

## 2014-01-24 ENCOUNTER — Other Ambulatory Visit: Payer: Self-pay | Admitting: Physician Assistant

## 2014-01-24 VITALS — BP 108/62 | HR 79 | Temp 97.9°F | Resp 16 | Ht 64.0 in | Wt 186.8 lb

## 2014-01-24 DIAGNOSIS — N644 Mastodynia: Secondary | ICD-10-CM

## 2014-01-24 DIAGNOSIS — N6320 Unspecified lump in the left breast, unspecified quadrant: Secondary | ICD-10-CM

## 2014-01-24 DIAGNOSIS — N63 Unspecified lump in unspecified breast: Secondary | ICD-10-CM

## 2014-01-24 DIAGNOSIS — N6325 Unspecified lump in the left breast, overlapping quadrants: Secondary | ICD-10-CM

## 2014-01-24 DIAGNOSIS — N926 Irregular menstruation, unspecified: Secondary | ICD-10-CM

## 2014-01-24 LAB — POCT URINE PREGNANCY: Preg Test, Ur: NEGATIVE

## 2014-01-24 NOTE — Progress Notes (Signed)
   Subjective:    Patient ID: Sheena Young, female    DOB: 09/23/1975, 38 y.o.   MRN: [id number]  HPI 38 year old female presents for evaluation of left breast lump. Noticed about 1 month ago. She is able to palpate a small lump in her left breast.  Also admits that she has developed bilateral breast tenderness in the past several weeks. States the pain is underneath both breasts and makes it difficulty to wear a bra. She has not noticed any overlying skin changes.   LNMP 6/28. Slight possibility of pregnancy. She has never been pregnant before.   Grandmother and 2 aunts with breast cancer both diagnosed in their 31's.    Review of Systems  Constitutional: Negative for fever, chills and unexpected weight change.  Cardiovascular: Positive for chest pain (bilateral breast tenderness).       Objective:   Physical Exam  Constitutional: She is oriented to person, place, and time. She appears well-developed and well-nourished.  HENT:  Head: Normocephalic and atraumatic.  Right Ear: External ear normal.  Left Ear: External ear normal.  Eyes: Conjunctivae are normal.  Neck: Normal range of motion.  Cardiovascular: Normal rate.   Pulmonary/Chest: Effort normal. Right breast exhibits tenderness (underside of breast). Right breast exhibits no inverted nipple, no nipple discharge and no skin change. Left breast exhibits tenderness (underside of breast). Left breast exhibits no inverted nipple, no nipple discharge and no skin change.    Neurological: She is alert and oriented to person, place, and time.  Psychiatric: She has a normal mood and affect. Her behavior is normal. Judgment and thought content normal.      Results for orders placed in visit on 01/24/14  POCT URINE PREGNANCY      Result Value Ref Range   Preg Test, Ur Negative         Assessment & Plan:  Lump of breast, left  Breast tenderness in female  Irregular menstrual cycle - Plan: POCT urine pregnancy  Plan to  have diagnostic mammogram on 7/21 at the [facility].  F/u based on results. Recommend repeat pregnancy test prior to mammogram.

## 2014-01-24 NOTE — Patient Instructions (Signed)
[facility]  Tuesday, July 21st 3:15 p.m. [phone]

## 2014-01-30 ENCOUNTER — Ambulatory Visit
Admission: RE | Admit: 2014-01-30 | Discharge: 2014-01-30 | Disposition: A | Discharge status: Home / Self Care | Payer: 59 | Source: Ambulatory Visit | Attending: Physician Assistant | Admitting: Physician Assistant

## 2014-01-30 ENCOUNTER — Other Ambulatory Visit: Payer: Self-pay | Admitting: Physician Assistant

## 2014-01-30 DIAGNOSIS — N6320 Unspecified lump in the left breast, unspecified quadrant: Principal | ICD-10-CM

## 2014-01-30 DIAGNOSIS — N6325 Unspecified lump in the left breast, overlapping quadrants: Secondary | ICD-10-CM

## 2014-02-26 ENCOUNTER — Telehealth: Payer: Self-pay

## 2014-02-26 NOTE — Telephone Encounter (Signed)
PT STATES SHE DOESN'T HAVE THE MONEY FOR SURGERY AND WOULD LIKE TO KNOW IF THE DR WOULD WRITE HER PRESCRIPTION FOR HYDROCODONE PLEASE CALL [phone]

## 2014-02-26 NOTE — Telephone Encounter (Signed)
Still have breast pain. Taking Aleve, 2 tablets daily, did not help she has been on and off since she was 11, therefore OTC medication does not help her.  Please advise.

## 2014-02-26 NOTE — Telephone Encounter (Signed)
I do not see that surgery was recommended-rather a follow-up mammogram and possible [place] in 6 months was recommended.  To reduce pain from breast cysts, avoid caffeine (even in chocolate).  Some women also find benefit with the use of hormonal contraceptives.   She can switch from Aleve to Ibuprofen (up to 800 mg TID with meals), or I can send in meloxicam 15 mg, 1 PO QD PRN #30, RF x 1.  Pain severe enough that she believes she needs narcotics (very unusual in breast pain) needs re-evaluation.

## 2014-02-26 NOTE — Telephone Encounter (Signed)
Spoke to pt, there was a miscommunication, it was not her breast. She is having ankle pain. She also has an appointment 03/05/2014 with ortho.  i explained due to it being 2 months since her last evalaluation for her ankle, she would need to RTC for a re-evalaluation of her ankle.  She understood, and stated she would call her ortho doctor and ask for something to alleviate the pain.

## 2014-03-22 ENCOUNTER — Other Ambulatory Visit: Payer: Self-pay | Admitting: Physician Assistant

## 2014-03-22 DIAGNOSIS — N63 Unspecified lump in unspecified breast: Secondary | ICD-10-CM

## 2014-05-01 ENCOUNTER — Ambulatory Visit (INDEPENDENT_AMBULATORY_CARE_PROVIDER_SITE_OTHER): Payer: 59 | Admitting: Family Medicine

## 2014-05-01 VITALS — BP 100/62 | HR 74 | Temp 98.2°F | Resp 18 | Ht 64.0 in | Wt 184.0 lb

## 2014-05-01 DIAGNOSIS — M5441 Lumbago with sciatica, right side: Secondary | ICD-10-CM

## 2014-05-01 DIAGNOSIS — N979 Female infertility, unspecified: Secondary | ICD-10-CM

## 2014-05-01 DIAGNOSIS — R112 Nausea with vomiting, unspecified: Secondary | ICD-10-CM

## 2014-05-01 LAB — POCT CBC
Granulocyte percent: 58.9 %G (ref 37–80)
HCT, POC: 43.5 % (ref 37.7–47.9)
HEMOGLOBIN: 13.9 g/dL (ref 12.2–16.2)
LYMPH, POC: 3.4 (ref 0.6–3.4)
MCH, POC: 31.4 pg — AB (ref 27–31.2)
MCHC: 32 g/dL (ref 31.8–35.4)
MCV: 98.4 fL — AB (ref 80–97)
MID (cbc): 0.4 (ref 0–0.9)
MPV: 8.2 fL (ref 0–99.8)
POC GRANULOCYTE: 5.5 (ref 2–6.9)
POC LYMPH PERCENT: 36.7 %L (ref 10–50)
POC MID %: 4.4 %M (ref 0–12)
Platelet Count, POC: 311 10*3/uL (ref 142–424)
RBC: 4.43 M/uL (ref 4.04–5.48)
RDW, POC: 13.7 %
WBC: 9.3 10*3/uL (ref 4.6–10.2)

## 2014-05-01 LAB — POCT URINE PREGNANCY: Preg Test, Ur: NEGATIVE

## 2014-05-01 MED ORDER — ONDANSETRON HCL 8 MG PO TABS: 8.0000 mg | ORAL_TABLET | Freq: Three times a day (TID) | ORAL | Status: DC | PRN

## 2014-05-01 MED ORDER — CYCLOBENZAPRINE HCL 10 MG PO TABS: ORAL_TABLET | ORAL | Status: DC

## 2014-05-01 MED ORDER — HYDROCODONE-ACETAMINOPHEN 5-325 MG PO TABS: 1.0000 | ORAL_TABLET | Freq: Three times a day (TID) | ORAL | Status: DC | PRN

## 2014-05-01 NOTE — Progress Notes (Signed)
[facility] [address] [place] [address] [phone]  Date:  05/01/2014   Name:  Sheena Young   DOB:  02-02-1976   MRN:  [id number]  PCP:  No PCP Per Patient    Chief Complaint: Nausea, Emesis and Back Pain   History of Present Illness:  Sheena Young is a 38 y.o. very pleasant female patient who presents with the following:  She is here today with a back issue. She has had acute trouble with her back for about one week.   She has a laminectomy/ decompression last year per Dr. Lynann Bologna.  Her back and done pretty well since then, would "get tight" sometimes but this would generally get better with laying down.  She still has pain in her right leg and some other right leg nerve issues; she will sometimes take neurontin for this. She did not follow-up with Dr. Lynann Bologna yet because she has new insurance and thought that she had to have a referral.  No bowel or bladder dysfunction  Also, she noted onset of GI sx early yesterday am, around 42. She has had sweats and chills. She had nausea, vomiting, and diarrhea, and a fever of about 101.6. She is now feeling a little better in this regard, and she is able to drink some fluid today.  She has not yet tried to eat today.  Diarrhea is now resolved, she is still bringing up some yellow material when she coughs but is not really vomiting. She has also noted an earache, and some cough.   Her menses are late. She has not yet taken an HCG at home- she does tend to have irregular menses. She and her husband have been trying to conceive for several years without success. She did have one ectopic pregnancy in the past and had a tube removed.    She is supposed to be taking neurontin for her right leg pain, but she just takes it as needed.  She has tried her flexeril but it did not seem to help.  She is able to take hydrocodone- no allergy.  She has itching with oxycodone  There are no active problems to display for  this patient.   Past Medical History  Diagnosis Date  . Gastritis, acute   . Gastritis   . Insomnia   . History of migraine     last one about 86months  . Headache(784.0)     occaionally  . Chronic back pain   . Joint pain   . Joint swelling   . Vomiting   . GERD (gastroesophageal reflux disease)     takes Zantac prn  . Overactive bladder     never filled prescription bc unable to afford  . Urinary urgency   . Urinary frequency   . Anxiety     but doesn't take any meds for this    Past Surgical History  Procedure Laterality Date  . Peridontal surgery    . Ectopic pregnancy surgery    . Colonoscopy    . Lumbar laminectomy/decompression microdiscectomy Right 11/03/2012    Procedure: LUMBAR LAMINECTOMY/DECOMPRESSION MICRODISCECTOMY  Right sided lumbar 4-5 microdisectomy;  Surgeon: Sinclair Ship, MD;  Location: [place];  Service: Orthopedics;  Laterality: Right;  Right sided lumbar 4-5 microdisectomy    History  Substance Use Topics  . Smoking status: Former Smoker -- 0.25 packs/day for 13 years    Types: E-cigarettes  . Smokeless tobacco: Never Used  Comment: of electronic cigarette  . Alcohol Use: 1.2 oz/week    2 Glasses of wine per week     Comment: occasionally    Family History  Problem Relation Age of Onset  . Colon polyps Neg Hx   . Colon cancer Neg Hx   . Cervical cancer Sister   . Diabetes Maternal Grandmother   . Diabetes Maternal Aunt     x 3  . Diabetes Maternal Uncle     x 3  . Congestive Heart Failure Maternal Grandmother   . Heart attack Paternal Grandfather   . Heart disease Maternal Uncle   . Liver disease Neg Hx   . Kidney disease Neg Hx     Allergies  Allergen Reactions  . Oxycodone     History of itching    Medication list has been reviewed and updated.  Current Outpatient Prescriptions on File Prior to Visit  Medication Sig Dispense Refill  . cyclobenzaprine (FLEXERIL) 5 MG tablet Take 5 mg by mouth 2 (two) times daily  as needed for muscle spasms.      Marland Kitchen gabapentin (NEURONTIN) 100 MG capsule Take 100 mg by mouth 3 (three) times daily.      . Simethicone (GAS-X PO) Take by mouth as needed.      Marland Kitchen HYDROcodone-acetaminophen (NORCO/VICODIN) 5-325 MG per tablet Take 1 tablet by mouth every 8 (eight) hours as needed.  30 tablet  0   No current facility-administered medications on file prior to visit.    Review of Systems:  As per HPI- otherwise negative.   Physical Examination: Filed Vitals:   05/01/14 1134  BP: 100/62  Pulse: 74  Temp: 98.2 F (36.8 C)  Resp: 18   Filed Vitals:   05/01/14 1134  Height: 5\' 4"  (1.626 m)  Weight: 184 lb (83.462 kg)   Body mass index is 31.57 kg/(m^2). Ideal Body Weight: Weight in (lb) to have BMI = 25: 145.3  GEN: WDWN, NAD, Non-toxic, A & O x 3, overweight, does not appear comfortable HEENT: Atraumatic, Normocephalic. Neck supple. No masses, No LAD. Ears and Nose: No external deformity. CV: RRR, No M/G/R. No JVD. No thrill. No extra heart sounds. PULM: CTA B, no wheezes, crackles, rhonchi. No retractions. No resp. distress. No accessory muscle use. ABD: S, NT, ND, +BS. No rebound. No HSM. EXTR: No c/c/e NEURO Normal gait.  PSYCH: Normally interactive. Conversant. Not depressed or anxious appearing.  Calm demeanor.  Abdomen is benign She notes tenderness in her right lower back and into her right leg.  Normal BLE strength, DTR.  positive SLR on the right.  No saddle anesthesia.    Results for orders placed in visit on 05/01/14  POCT CBC      Result Value Ref Range   WBC 9.3  4.6 - 10.2 K/uL   Lymph, poc 3.4  0.6 - 3.4   POC LYMPH PERCENT 36.7  10 - 50 %L   MID (cbc) 0.4  0 - 0.9   POC MID % 4.4  0 - 12 %M   POC Granulocyte 5.5  2 - 6.9   Granulocyte percent 58.9  37 - 80 %G   RBC 4.43  4.04 - 5.48 M/uL   Hemoglobin 13.9  12.2 - 16.2 g/dL   HCT, POC 43.5  37.7 - 47.9 %   MCV 98.4 (*) 80 - 97 fL   MCH, POC 31.4 (*) 27 - 31.2 pg   MCHC 32.0  31.8 -  35.4 g/dL  RDW, POC 13.7     Platelet Count, POC 311  142 - 424 K/uL   MPV 8.2  0 - 99.8 fL  POCT URINE PREGNANCY      Result Value Ref Range   Preg Test, Ur Negative      BP Readings from Last 3 Encounters:  05/01/14 100/62  01/24/14 108/62  12/22/13 100/62    Assessment and Plan: Non-intractable vomiting with nausea, vomiting of unspecified type - Plan: POCT CBC, POCT urine pregnancy, ondansetron (ZOFRAN) 8 MG tablet  Female fertility problem - Plan: Ambulatory referral to Obstetrics / Gynecology, POCT CBC, POCT urine pregnancy  Bilateral low back pain with right-sided sciatica - Plan: Ambulatory referral to Orthopedic Surgery, POCT CBC, POCT urine pregnancy, cyclobenzaprine (FLEXERIL) 10 MG tablet, HYDROcodone-acetaminophen (NORCO/VICODIN) 5-325 MG per tablet   [place] is here today with back pain for a week.  She has pain into her right leg but this is now new.  Will treat with flexeril, vicodin; she will let me know if not better in the next couple of days.  Will place a referral so she can see her surgeon again.  Cautioned her about excess sedation if she uses both flexeril and vicodin together  Her GI symptoms are abating.  She will use zofran as needed, let me know if not continuing to improve She is 37 and wants to have a child.  Will refer to OBG for evaluation, and encouraged her to have her husband do a semen analysis.    Signed Lamar Blinks, MD

## 2014-05-01 NOTE — Patient Instructions (Signed)
Use the flexeril as needed for muscle spasm and the vicodin for more severe pain.  Use the zofran as needed for nausea  Have your husband call [facility] and ask them about semen testing for fertility concerns.  If he needs a referral let me know. We will get you set up with OB-GYN.  I hope that the worst is over as far as your GI bug, but let me know if you continue to have symptoms.  Eat a bland, light diet the next few days and push fluids

## 2014-08-31 ENCOUNTER — Other Ambulatory Visit: Payer: Self-pay | Admitting: Physician Assistant

## 2014-09-03 ENCOUNTER — Other Ambulatory Visit: Payer: 59

## 2014-09-10 ENCOUNTER — Ambulatory Visit
Admission: RE | Admit: 2014-09-10 | Discharge: 2014-09-10 | Disposition: A | Discharge status: Home / Self Care | Payer: Self-pay | Source: Ambulatory Visit | Attending: Physician Assistant | Admitting: Physician Assistant

## 2014-09-10 ENCOUNTER — Other Ambulatory Visit: Payer: Self-pay | Admitting: Physician Assistant

## 2014-09-10 ENCOUNTER — Ambulatory Visit
Admission: RE | Admit: 2014-09-10 | Discharge: 2014-09-10 | Disposition: A | Discharge status: Home / Self Care | Payer: 59 | Source: Ambulatory Visit | Attending: Physician Assistant | Admitting: Physician Assistant

## 2014-09-10 ENCOUNTER — Ambulatory Visit
Admission: RE | Admit: 2014-09-10 | Discharge: 2014-09-10 | Disposition: A | Discharge status: Home / Self Care | Payer: 59 | Source: Ambulatory Visit | Attending: Emergency Medicine | Admitting: Emergency Medicine

## 2014-09-10 DIAGNOSIS — N63 Unspecified lump in unspecified breast: Secondary | ICD-10-CM

## 2015-01-03 ENCOUNTER — Ambulatory Visit (INDEPENDENT_AMBULATORY_CARE_PROVIDER_SITE_OTHER): Payer: 59 | Admitting: Family Medicine

## 2015-01-03 VITALS — BP 104/73 | HR 78 | Temp 98.0°F | Resp 18

## 2015-01-03 DIAGNOSIS — Z9889 Other specified postprocedural states: Secondary | ICD-10-CM

## 2015-01-03 DIAGNOSIS — M5441 Lumbago with sciatica, right side: Secondary | ICD-10-CM | POA: Diagnosis not present

## 2015-01-03 MED ORDER — CYCLOBENZAPRINE HCL 10 MG PO TABS: ORAL_TABLET | ORAL | Status: DC

## 2015-01-03 MED ORDER — OXYCODONE-ACETAMINOPHEN 5-325 MG PO TABS: 1.0000 | ORAL_TABLET | ORAL | Status: DC | PRN

## 2015-01-03 MED ORDER — PREDNISONE 20 MG PO TABS: ORAL_TABLET | ORAL | Status: DC

## 2015-01-03 NOTE — Progress Notes (Signed)
  Subjective:  Patient ID: Sheena Young, female    DOB: 08/14/1975  Age: 39 y.o. MRN: [id number]  39 year old lady who usually goes to see Dr. Edilia Bo for her primary care. The patient is in here today with severe low back pain and radiculopathy down to behind her right knee. Yesterday she had take longer to the laundromat since they're drier was out. She had to carry this in, strained her back a little bit but did not have particularly unusual back pain. Last evening her pain was tightening up more. She took a pain pill that her husband had. She hurts badly during the night but this morning could hardly move because of the severe low back pain diffusely in the lumbar region and down behind the right knee. She had a disc surgery done a year ago with excellent results and felt good for a stretch of time. Dr. Joan Flores was her surgeon. Bowel and bladder intact.  She doesn't like taking steroids, but understands the need for them. Talked about watching weight.     Ob jective:   Pleasant lady, in significant pain, first relating on her right side. Abdomen soft. Mild tenderness of the low back generally. Straight leg raising positive at 40 on the right, negative on the left.  Assessment & Plan:   Assessment: Low back pain with right sciatica History of lumbar discectomy  Moderate decision-making: Decide on referral to her back doctor for follow-up.  Plan: Patient Instructions  Take the Percocet one every 4-6 hours if needed for severe pain  For milder pain take Aleve 2 pills twice daily or ibuprofen 600 take 100 mg 3 times daily  Take the Flexeril muscle relaxant 3 times daily as needed. If it makes her drowsy in the daytime take just one half pill at daytime doses and 1 pill at night  Take prednisone 3 pills once daily for 2 days, then 2 pills once daily for 2 days, then 1 pill daily for 2 days, then one half pill daily for 4 days  Try to see your orthopedist back for further  evaluation. If unable to get in there and pain is continuing badly over the next week or so return here.  Go to the emergency room or return here if further neurologic symptoms such as problems with bowel or bladder control.    Regina Ganci, MD 01/03/2015

## 2015-01-03 NOTE — Patient Instructions (Signed)
Take the Percocet one every 4-6 hours if needed for severe pain  For milder pain take Aleve 2 pills twice daily or ibuprofen 600 take 100 mg 3 times daily  Take the Flexeril muscle relaxant 3 times daily as needed. If it makes her drowsy in the daytime take just one half pill at daytime doses and 1 pill at night  Take prednisone 3 pills once daily for 2 days, then 2 pills once daily for 2 days, then 1 pill daily for 2 days, then one half pill daily for 4 days  Try to see your orthopedist back for further evaluation. If unable to get in there and pain is continuing badly over the next week or so return here.  Go to the emergency room or return here if further neurologic symptoms such as problems with bowel or bladder control.

## 2015-01-18 ENCOUNTER — Telehealth: Payer: Self-pay

## 2015-01-18 NOTE — Telephone Encounter (Signed)
Pt is needing a refill on pain medication

## 2015-01-30 ENCOUNTER — Other Ambulatory Visit: Payer: Self-pay | Admitting: Orthopedic Surgery

## 2015-01-30 ENCOUNTER — Other Ambulatory Visit: Payer: Self-pay | Admitting: Family Medicine

## 2015-01-30 DIAGNOSIS — N63 Unspecified lump in unspecified breast: Secondary | ICD-10-CM

## 2015-01-30 DIAGNOSIS — M545 Low back pain: Secondary | ICD-10-CM

## 2015-02-06 ENCOUNTER — Inpatient Hospital Stay: Admission: RE | Admit: 2015-02-06 | Payer: 59 | Source: Ambulatory Visit

## 2015-02-06 ENCOUNTER — Ambulatory Visit
Admission: RE | Admit: 2015-02-06 | Discharge: 2015-02-06 | Disposition: A | Discharge status: Home / Self Care | Payer: 59 | Source: Ambulatory Visit | Attending: Orthopedic Surgery | Admitting: Orthopedic Surgery

## 2015-02-06 DIAGNOSIS — M545 Low back pain: Secondary | ICD-10-CM

## 2015-02-06 MED ORDER — GADOBENATE DIMEGLUMINE 529 MG/ML IV SOLN
17.0000 mL | Freq: Once | INTRAVENOUS | Status: AC | PRN
  Administered 2015-02-06: 17 mL via INTRAVENOUS

## 2015-02-21 ENCOUNTER — Other Ambulatory Visit: Payer: Self-pay | Admitting: Family Medicine

## 2015-02-21 ENCOUNTER — Other Ambulatory Visit: Payer: Self-pay

## 2015-02-21 DIAGNOSIS — N63 Unspecified lump in unspecified breast: Secondary | ICD-10-CM

## 2015-02-22 ENCOUNTER — Other Ambulatory Visit: Payer: Self-pay | Admitting: Family Medicine

## 2015-02-22 ENCOUNTER — Ambulatory Visit
Admission: RE | Admit: 2015-02-22 | Discharge: 2015-02-22 | Disposition: A | Discharge status: Home / Self Care | Payer: 59 | Source: Ambulatory Visit | Attending: Family Medicine | Admitting: Family Medicine

## 2015-02-22 ENCOUNTER — Other Ambulatory Visit: Payer: Self-pay | Admitting: Emergency Medicine

## 2015-02-22 ENCOUNTER — Other Ambulatory Visit: Payer: 59

## 2015-02-22 DIAGNOSIS — N63 Unspecified lump in unspecified breast: Secondary | ICD-10-CM

## 2015-03-08 ENCOUNTER — Other Ambulatory Visit: Payer: Self-pay | Admitting: Orthopedic Surgery

## 2015-03-12 ENCOUNTER — Encounter (HOSPITAL_COMMUNITY): Payer: Self-pay | Admitting: *Deleted

## 2015-03-12 MED ORDER — CEFAZOLIN SODIUM-DEXTROSE 2-3 GM-% IV SOLR
2.0000 g | INTRAVENOUS | Status: AC
  Administered 2015-03-13 (×2): 2 g via INTRAVENOUS
  Filled 2015-03-12: qty 50

## 2015-03-12 MED ORDER — POVIDONE-IODINE 7.5 % EX SOLN
Freq: Once | CUTANEOUS | Status: DC
  Filled 2015-03-12: qty 118

## 2015-03-13 ENCOUNTER — Inpatient Hospital Stay (HOSPITAL_COMMUNITY)
Admission: AD | Admit: 2015-03-13 | Discharge: 2015-03-14 | DRG: 460 | Disposition: A | Discharge status: Home / Self Care | Payer: 59 | Source: Ambulatory Visit | Attending: Orthopedic Surgery | Admitting: Orthopedic Surgery

## 2015-03-13 ENCOUNTER — Encounter (HOSPITAL_COMMUNITY): Payer: Self-pay | Admitting: *Deleted

## 2015-03-13 ENCOUNTER — Inpatient Hospital Stay (HOSPITAL_COMMUNITY): Payer: 59

## 2015-03-13 ENCOUNTER — Encounter (HOSPITAL_COMMUNITY)
Admission: AD | Disposition: A | Discharge status: Home / Self Care | Payer: 59 | Source: Ambulatory Visit | Attending: Orthopedic Surgery

## 2015-03-13 ENCOUNTER — Inpatient Hospital Stay (HOSPITAL_COMMUNITY): Payer: 59 | Admitting: Certified Registered"

## 2015-03-13 DIAGNOSIS — M5116 Intervertebral disc disorders with radiculopathy, lumbar region: Principal | ICD-10-CM | POA: Diagnosis present

## 2015-03-13 DIAGNOSIS — Z87891 Personal history of nicotine dependence: Secondary | ICD-10-CM

## 2015-03-13 DIAGNOSIS — Z01818 Encounter for other preprocedural examination: Secondary | ICD-10-CM

## 2015-03-13 DIAGNOSIS — Z419 Encounter for procedure for purposes other than remedying health state, unspecified: Secondary | ICD-10-CM

## 2015-03-13 DIAGNOSIS — M5410 Radiculopathy, site unspecified: Secondary | ICD-10-CM | POA: Diagnosis present

## 2015-03-13 DIAGNOSIS — K219 Gastro-esophageal reflux disease without esophagitis: Secondary | ICD-10-CM | POA: Diagnosis present

## 2015-03-13 DIAGNOSIS — M79604 Pain in right leg: Secondary | ICD-10-CM | POA: Diagnosis present

## 2015-03-13 DIAGNOSIS — Z79899 Other long term (current) drug therapy: Secondary | ICD-10-CM

## 2015-03-13 HISTORY — DX: Reserved for inherently not codable concepts without codable children: IMO0001

## 2015-03-13 LAB — URINALYSIS, ROUTINE W REFLEX MICROSCOPIC
Bilirubin Urine: NEGATIVE
Glucose, UA: NEGATIVE mg/dL
Hgb urine dipstick: NEGATIVE
KETONES UR: NEGATIVE mg/dL
LEUKOCYTES UA: NEGATIVE
NITRITE: NEGATIVE
PH: 8 (ref 5.0–8.0)
Protein, ur: NEGATIVE mg/dL
Specific Gravity, Urine: 1.024 (ref 1.005–1.030)
Urobilinogen, UA: 0.2 mg/dL (ref 0.0–1.0)

## 2015-03-13 LAB — HCG, SERUM, QUALITATIVE: Preg, Serum: NEGATIVE

## 2015-03-13 LAB — CBC WITH DIFFERENTIAL/PLATELET
BASOS ABS: 0 10*3/uL (ref 0.0–0.1)
Basophils Relative: 0 % (ref 0–1)
Eosinophils Absolute: 0.2 10*3/uL (ref 0.0–0.7)
Eosinophils Relative: 2 % (ref 0–5)
HEMATOCRIT: 39.2 % (ref 36.0–46.0)
Hemoglobin: 13.2 g/dL (ref 12.0–15.0)
LYMPHS ABS: 2.8 10*3/uL (ref 0.7–4.0)
LYMPHS PCT: 28 % (ref 12–46)
MCH: 32 pg (ref 26.0–34.0)
MCHC: 33.7 g/dL (ref 30.0–36.0)
MCV: 95.1 fL (ref 78.0–100.0)
Monocytes Absolute: 0.6 10*3/uL (ref 0.1–1.0)
Monocytes Relative: 6 % (ref 3–12)
NEUTROS ABS: 6.4 10*3/uL (ref 1.7–7.7)
Neutrophils Relative %: 64 % (ref 43–77)
Platelets: 317 10*3/uL (ref 150–400)
RBC: 4.12 MIL/uL (ref 3.87–5.11)
RDW: 13.3 % (ref 11.5–15.5)
WBC: 10.1 10*3/uL (ref 4.0–10.5)

## 2015-03-13 LAB — SURGICAL PCR SCREEN
MRSA, PCR: NEGATIVE
Staphylococcus aureus: NEGATIVE

## 2015-03-13 LAB — COMPREHENSIVE METABOLIC PANEL
ALK PHOS: 46 U/L (ref 38–126)
ALT: 17 U/L (ref 14–54)
AST: 21 U/L (ref 15–41)
Albumin: 3.9 g/dL (ref 3.5–5.0)
Anion gap: 6 (ref 5–15)
BILIRUBIN TOTAL: 0.5 mg/dL (ref 0.3–1.2)
BUN: 10 mg/dL (ref 6–20)
CALCIUM: 9.1 mg/dL (ref 8.9–10.3)
CHLORIDE: 107 mmol/L (ref 101–111)
CO2: 26 mmol/L (ref 22–32)
CREATININE: 0.64 mg/dL (ref 0.44–1.00)
GFR calc Af Amer: >60 mL/min (ref >60)
Glucose, Bld: 93 mg/dL (ref 65–99)
Potassium: 4.1 mmol/L (ref 3.5–5.1)
Sodium: 139 mmol/L (ref 135–145)
TOTAL PROTEIN: 6.3 g/dL — AB (ref 6.5–8.1)

## 2015-03-13 LAB — TYPE AND SCREEN
ABO/RH(D): O POS
Antibody Screen: NEGATIVE

## 2015-03-13 LAB — APTT: APTT: 29 s (ref 24–37)

## 2015-03-13 LAB — PROTIME-INR
INR: 1.04 (ref 0.00–1.49)
PROTHROMBIN TIME: 13.8 s (ref 11.6–15.2)

## 2015-03-13 SURGERY — POSTERIOR LUMBAR FUSION 1 LEVEL
Anesthesia: General | Site: Spine Lumbar | Laterality: Right

## 2015-03-13 MED ORDER — DOCUSATE SODIUM 100 MG PO CAPS
100.0000 mg | ORAL_CAPSULE | Freq: Two times a day (BID) | ORAL | Status: DC
  Administered 2015-03-13: 100 mg via ORAL
  Filled 2015-03-13: qty 1

## 2015-03-13 MED ORDER — OXYCODONE-ACETAMINOPHEN 5-325 MG PO TABS
1.0000 | ORAL_TABLET | ORAL | Status: DC | PRN
  Administered 2015-03-13 – 2015-03-14 (×3): 2 via ORAL
  Filled 2015-03-13 (×3): qty 2

## 2015-03-13 MED ORDER — FLEET ENEMA 7-19 GM/118ML RE ENEM: 1.0000 | ENEMA | Freq: Once | RECTAL | Status: DC | PRN

## 2015-03-13 MED ORDER — FENTANYL CITRATE (PF) 100 MCG/2ML IJ SOLN
INTRAMUSCULAR | Status: DC | PRN
  Administered 2015-03-13: 150 ug via INTRAVENOUS
  Administered 2015-03-13: 50 ug via INTRAVENOUS
  Administered 2015-03-13: 100 ug via INTRAVENOUS
  Administered 2015-03-13 (×2): 50 ug via INTRAVENOUS
  Administered 2015-03-13: 100 ug via INTRAVENOUS

## 2015-03-13 MED ORDER — MINERAL OIL LIGHT 100 % EX OIL
TOPICAL_OIL | CUTANEOUS | Status: DC | PRN
  Administered 2015-03-13: 1 via TOPICAL

## 2015-03-13 MED ORDER — LACTATED RINGERS IV SOLN
INTRAVENOUS | Status: DC | PRN
  Administered 2015-03-13 (×3): via INTRAVENOUS

## 2015-03-13 MED ORDER — MUPIROCIN 2 % EX OINT
1.0000 "application " | TOPICAL_OINTMENT | Freq: Once | CUTANEOUS | Status: AC
  Administered 2015-03-13: 1 via TOPICAL
  Filled 2015-03-13: qty 22

## 2015-03-13 MED ORDER — GLYCOPYRROLATE 0.2 MG/ML IJ SOLN: INTRAMUSCULAR | Status: DC | PRN

## 2015-03-13 MED ORDER — FENTANYL CITRATE (PF) 250 MCG/5ML IJ SOLN
INTRAMUSCULAR | Status: AC
  Filled 2015-03-13: qty 5

## 2015-03-13 MED ORDER — BUPIVACAINE-EPINEPHRINE (PF) 0.25% -1:200000 IJ SOLN
INTRAMUSCULAR | Status: AC
  Filled 2015-03-13: qty 30

## 2015-03-13 MED ORDER — LACTATED RINGERS IV SOLN
Freq: Once | INTRAVENOUS | Status: AC
  Administered 2015-03-13: 12:00:00 via INTRAVENOUS

## 2015-03-13 MED ORDER — ONDANSETRON HCL 4 MG/2ML IJ SOLN
4.0000 mg | INTRAMUSCULAR | Status: DC | PRN
  Administered 2015-03-13 – 2015-03-14 (×2): 4 mg via INTRAVENOUS
  Filled 2015-03-13 (×2): qty 2

## 2015-03-13 MED ORDER — DIAZEPAM 5 MG PO TABS
ORAL_TABLET | ORAL | Status: AC
  Filled 2015-03-13: qty 1

## 2015-03-13 MED ORDER — MENTHOL 3 MG MT LOZG: 1.0000 | LOZENGE | OROMUCOSAL | Status: DC | PRN

## 2015-03-13 MED ORDER — ACETAMINOPHEN 650 MG RE SUPP: 650.0000 mg | RECTAL | Status: DC | PRN

## 2015-03-13 MED ORDER — METHYLENE BLUE 1 % INJ SOLN
INTRAMUSCULAR | Status: DC | PRN
  Administered 2015-03-13: .2 mL

## 2015-03-13 MED ORDER — SIMETHICONE 80 MG PO CHEW
80.0000 mg | CHEWABLE_TABLET | Freq: Four times a day (QID) | ORAL | Status: DC | PRN
  Filled 2015-03-13: qty 1

## 2015-03-13 MED ORDER — METHYLENE BLUE 1 % INJ SOLN
INTRAMUSCULAR | Status: AC
  Filled 2015-03-13: qty 10

## 2015-03-13 MED ORDER — SODIUM CHLORIDE 0.9 % IJ SOLN: 3.0000 mL | INTRAMUSCULAR | Status: DC | PRN

## 2015-03-13 MED ORDER — 0.9 % SODIUM CHLORIDE (POUR BTL) OPTIME
TOPICAL | Status: DC | PRN
  Administered 2015-03-13: 2000 mL

## 2015-03-13 MED ORDER — PROPOFOL INFUSION 10 MG/ML OPTIME
INTRAVENOUS | Status: DC | PRN
  Administered 2015-03-13: 75 ug/kg/min via INTRAVENOUS

## 2015-03-13 MED ORDER — MINERAL OIL LIGHT 100 % EX OIL
TOPICAL_OIL | CUTANEOUS | Status: AC
  Filled 2015-03-13: qty 25

## 2015-03-13 MED ORDER — NEOSTIGMINE METHYLSULFATE 10 MG/10ML IV SOLN: INTRAVENOUS | Status: DC | PRN

## 2015-03-13 MED ORDER — MIDAZOLAM HCL 5 MG/5ML IJ SOLN
INTRAMUSCULAR | Status: DC | PRN
  Administered 2015-03-13: 2 mg via INTRAVENOUS

## 2015-03-13 MED ORDER — ZOLPIDEM TARTRATE 5 MG PO TABS: 5.0000 mg | ORAL_TABLET | Freq: Every evening | ORAL | Status: DC | PRN

## 2015-03-13 MED ORDER — HYDROMORPHONE HCL 1 MG/ML IJ SOLN
0.2500 mg | INTRAMUSCULAR | Status: DC | PRN
  Administered 2015-03-13 (×4): 0.5 mg via INTRAVENOUS

## 2015-03-13 MED ORDER — THROMBIN 20000 UNITS EX SOLR
CUTANEOUS | Status: AC
  Filled 2015-03-13: qty 20000

## 2015-03-13 MED ORDER — SODIUM CHLORIDE 0.9 % IJ SOLN: 3.0000 mL | Freq: Two times a day (BID) | INTRAMUSCULAR | Status: DC

## 2015-03-13 MED ORDER — LIDOCAINE HCL (CARDIAC) 20 MG/ML IV SOLN
INTRAVENOUS | Status: DC | PRN
  Administered 2015-03-13: 60 mg via INTRAVENOUS

## 2015-03-13 MED ORDER — HYDROMORPHONE HCL 1 MG/ML IJ SOLN
INTRAMUSCULAR | Status: AC
  Administered 2015-03-13: 0.5 mg via INTRAVENOUS
  Filled 2015-03-13: qty 2

## 2015-03-13 MED ORDER — NEOSTIGMINE METHYLSULFATE 10 MG/10ML IV SOLN
INTRAVENOUS | Status: DC | PRN
  Administered 2015-03-13: 2 mg via INTRAMUSCULAR

## 2015-03-13 MED ORDER — ALUM & MAG HYDROXIDE-SIMETH 200-200-20 MG/5ML PO SUSP: 30.0000 mL | Freq: Four times a day (QID) | ORAL | Status: DC | PRN

## 2015-03-13 MED ORDER — METOCLOPRAMIDE HCL 5 MG/ML IJ SOLN
INTRAMUSCULAR | Status: DC | PRN
  Administered 2015-03-13: 10 mg via INTRAVENOUS

## 2015-03-13 MED ORDER — DIAZEPAM 5 MG PO TABS
5.0000 mg | ORAL_TABLET | Freq: Four times a day (QID) | ORAL | Status: DC | PRN
  Administered 2015-03-13 – 2015-03-14 (×2): 5 mg via ORAL
  Filled 2015-03-13: qty 1

## 2015-03-13 MED ORDER — ONDANSETRON HCL 4 MG/2ML IJ SOLN
INTRAMUSCULAR | Status: DC | PRN
  Administered 2015-03-13 (×2): 4 mg via INTRAVENOUS

## 2015-03-13 MED ORDER — SODIUM CHLORIDE 0.9 % IV SOLN: 250.0000 mL | INTRAVENOUS | Status: DC

## 2015-03-13 MED ORDER — VECURONIUM BROMIDE 10 MG IV SOLR
INTRAVENOUS | Status: DC | PRN
  Administered 2015-03-13: 10 mg via INTRAVENOUS

## 2015-03-13 MED ORDER — PROMETHAZINE HCL 25 MG/ML IJ SOLN: 6.2500 mg | INTRAMUSCULAR | Status: DC | PRN

## 2015-03-13 MED ORDER — PROPOFOL 10 MG/ML IV BOLUS
INTRAVENOUS | Status: DC | PRN
  Administered 2015-03-13: 150 mg via INTRAVENOUS

## 2015-03-13 MED ORDER — KETOROLAC TROMETHAMINE 0.5 % OP SOLN
1.0000 [drp] | Freq: Four times a day (QID) | OPHTHALMIC | Status: DC
  Administered 2015-03-13: 1 [drp] via OPHTHALMIC
  Filled 2015-03-13: qty 3

## 2015-03-13 MED ORDER — MORPHINE SULFATE (PF) 2 MG/ML IV SOLN
1.0000 mg | INTRAVENOUS | Status: DC | PRN
  Administered 2015-03-13 – 2015-03-14 (×2): 2 mg via INTRAVENOUS
  Filled 2015-03-13 (×2): qty 1

## 2015-03-13 MED ORDER — ONDANSETRON HCL 4 MG PO TABS: 4.0000 mg | ORAL_TABLET | ORAL | Status: DC | PRN

## 2015-03-13 MED ORDER — CEFAZOLIN SODIUM 1-5 GM-% IV SOLN
1.0000 g | Freq: Three times a day (TID) | INTRAVENOUS | Status: DC
Start: 2015-03-14 — End: 2015-03-14
  Administered 2015-03-14: 1 g via INTRAVENOUS
  Filled 2015-03-13 (×2): qty 50

## 2015-03-13 MED ORDER — PHENOL 1.4 % MT LIQD: 1.0000 | OROMUCOSAL | Status: DC | PRN

## 2015-03-13 MED ORDER — BISACODYL 5 MG PO TBEC: 5.0000 mg | DELAYED_RELEASE_TABLET | Freq: Every day | ORAL | Status: DC | PRN

## 2015-03-13 MED ORDER — SODIUM CHLORIDE 0.9 % IV SOLN
INTRAVENOUS | Status: DC
Start: 2015-03-13 — End: 2015-03-14

## 2015-03-13 MED ORDER — SENNOSIDES-DOCUSATE SODIUM 8.6-50 MG PO TABS: 1.0000 | ORAL_TABLET | Freq: Every evening | ORAL | Status: DC | PRN

## 2015-03-13 MED ORDER — ACETAMINOPHEN 325 MG PO TABS: 650.0000 mg | ORAL_TABLET | ORAL | Status: DC | PRN

## 2015-03-13 MED ORDER — MIDAZOLAM HCL 2 MG/2ML IJ SOLN
INTRAMUSCULAR | Status: AC
  Filled 2015-03-13: qty 4

## 2015-03-13 MED ORDER — ALBUMIN HUMAN 5 % IV SOLN
INTRAVENOUS | Status: DC | PRN
  Administered 2015-03-13: 17:00:00 via INTRAVENOUS

## 2015-03-13 MED ORDER — GLYCOPYRROLATE 0.2 MG/ML IJ SOLN
INTRAMUSCULAR | Status: DC | PRN
Start: 2015-03-13 — End: 2015-03-13
  Administered 2015-03-13: .3 mg via INTRAVENOUS

## 2015-03-13 MED ORDER — GABAPENTIN 300 MG PO CAPS
300.0000 mg | ORAL_CAPSULE | Freq: Three times a day (TID) | ORAL | Status: DC
  Administered 2015-03-13: 300 mg via ORAL
  Filled 2015-03-13: qty 1

## 2015-03-13 SURGICAL SUPPLY — 86 items
APL SKNCLS STERI-STRIP NONHPOA (GAUZE/BANDAGES/DRESSINGS) ×1
BENZOIN TINCTURE PRP APPL 2/3 (GAUZE/BANDAGES/DRESSINGS) ×2 IMPLANT
BLADE SURG ROTATE 9660 (MISCELLANEOUS) IMPLANT
BUR PRESCISION 1.7 ELITE (BURR) ×2 IMPLANT
BUR ROUND PRECISION 4.0 (BURR) IMPLANT
BUR SABER RD CUTTING 3.0 (BURR) IMPLANT
CAGE BULLET CONCORDE 9X9X27 (Cage) ×1 IMPLANT
CARTRIDGE OIL MAESTRO DRILL (MISCELLANEOUS) ×1 IMPLANT
CLSR STERI-STRIP ANTIMIC 1/2X4 (GAUZE/BANDAGES/DRESSINGS) ×1 IMPLANT
CONT SPEC STER OR (MISCELLANEOUS) ×2 IMPLANT
COVER MAYO STAND STRL (DRAPES) ×4 IMPLANT
COVER SURGICAL LIGHT HANDLE (MISCELLANEOUS) ×2 IMPLANT
DIFFUSER DRILL AIR PNEUMATIC (MISCELLANEOUS) ×2 IMPLANT
DRAIN CHANNEL 15F RND FF W/TCR (WOUND CARE) IMPLANT
DRAPE C-ARM 42X72 X-RAY (DRAPES) ×2 IMPLANT
DRAPE C-ARMOR (DRAPES) IMPLANT
DRAPE POUCH INSTRU U-SHP 10X18 (DRAPES) ×2 IMPLANT
DRAPE SURG 17X23 STRL (DRAPES) ×8 IMPLANT
DURAPREP 26ML APPLICATOR (WOUND CARE) ×2 IMPLANT
ELECT BLADE 4.0 EZ CLEAN MEGAD (MISCELLANEOUS) ×2
ELECT CAUTERY BLADE 6.4 (BLADE) ×2 IMPLANT
ELECT REM PT RETURN 9FT ADLT (ELECTROSURGICAL) ×2
ELECTRODE BLDE 4.0 EZ CLN MEGD (MISCELLANEOUS) ×1 IMPLANT
ELECTRODE REM PT RTRN 9FT ADLT (ELECTROSURGICAL) ×1 IMPLANT
EVACUATOR SILICONE 100CC (DRAIN) IMPLANT
FEE INTRAOP MONITOR IMPULS NCS (MISCELLANEOUS) IMPLANT
GAUZE SPONGE 4X4 12PLY STRL (GAUZE/BANDAGES/DRESSINGS) ×2 IMPLANT
GAUZE SPONGE 4X4 16PLY XRAY LF (GAUZE/BANDAGES/DRESSINGS) ×8 IMPLANT
GLOVE BIO SURGEON STRL SZ7 (GLOVE) ×2 IMPLANT
GLOVE BIO SURGEON STRL SZ8 (GLOVE) ×2 IMPLANT
GLOVE BIOGEL PI IND STRL 7.0 (GLOVE) ×1 IMPLANT
GLOVE BIOGEL PI IND STRL 8 (GLOVE) ×1 IMPLANT
GLOVE BIOGEL PI INDICATOR 7.0 (GLOVE) ×1
GLOVE BIOGEL PI INDICATOR 8 (GLOVE) ×1
GOWN STRL REUS W/ TWL LRG LVL3 (GOWN DISPOSABLE) ×2 IMPLANT
GOWN STRL REUS W/ TWL XL LVL3 (GOWN DISPOSABLE) ×1 IMPLANT
GOWN STRL REUS W/TWL LRG LVL3 (GOWN DISPOSABLE) ×4
GOWN STRL REUS W/TWL XL LVL3 (GOWN DISPOSABLE) ×2
INTRAOP MONITOR FEE IMPULS NCS (MISCELLANEOUS) ×1
INTRAOP MONITOR FEE IMPULSE (MISCELLANEOUS) ×1
IV CATH 14GX2 1/4 (CATHETERS) ×2 IMPLANT
KIT BASIN OR (CUSTOM PROCEDURE TRAY) ×2 IMPLANT
KIT POSITION SURG JACKSON T1 (MISCELLANEOUS) ×2 IMPLANT
KIT ROOM TURNOVER OR (KITS) ×2 IMPLANT
MARKER SKIN DUAL TIP RULER LAB (MISCELLANEOUS) ×2 IMPLANT
MILL MEDIUM DISP (BLADE) ×1 IMPLANT
MIX DBX 10CC 35% BONE (Bone Implant) ×1 IMPLANT
NDL HYPO 25GX1X1/2 BEV (NEEDLE) ×1 IMPLANT
NDL SAFETY ECLIPSE 18X1.5 (NEEDLE) ×1 IMPLANT
NDL SPNL 18GX3.5 QUINCKE PK (NEEDLE) ×2 IMPLANT
NEEDLE 22X1 1/2 (OR ONLY) (NEEDLE) ×2 IMPLANT
NEEDLE HYPO 18GX1.5 SHARP (NEEDLE) ×2
NEEDLE HYPO 25GX1X1/2 BEV (NEEDLE) ×2 IMPLANT
NEEDLE SPNL 18GX3.5 QUINCKE PK (NEEDLE) ×4 IMPLANT
NS IRRIG 1000ML POUR BTL (IV SOLUTION) ×2 IMPLANT
OIL CARTRIDGE MAESTRO DRILL (MISCELLANEOUS) ×2
PACK LAMINECTOMY ORTHO (CUSTOM PROCEDURE TRAY) ×2 IMPLANT
PACK UNIVERSAL I (CUSTOM PROCEDURE TRAY) ×2 IMPLANT
PAD ARMBOARD 7.5X6 YLW CONV (MISCELLANEOUS) ×4 IMPLANT
PATTIES SURGICAL .5 X1 (DISPOSABLE) ×2 IMPLANT
PATTIES SURGICAL .5X1.5 (GAUZE/BANDAGES/DRESSINGS) ×2 IMPLANT
PROBE PEDCLE PROBE MAGSTM DISP (MISCELLANEOUS) ×1 IMPLANT
ROD PRE BENT EXPEDIUM 35MM (Rod) ×2 IMPLANT
SCREW SET SINGLE INNER (Screw) ×4 IMPLANT
SCREW VIPER CORT FIX 6X35 (Screw) ×4 IMPLANT
SPONGE GAUZE 4X4 12PLY STER LF (GAUZE/BANDAGES/DRESSINGS) ×1 IMPLANT
SPONGE INTESTINAL PEANUT (DISPOSABLE) ×2 IMPLANT
SPONGE SURGIFOAM ABS GEL 100 (HEMOSTASIS) ×2 IMPLANT
STRIP CLOSURE SKIN 1/2X4 (GAUZE/BANDAGES/DRESSINGS) ×4 IMPLANT
SURGIFLO W/THROMBIN 8M KIT (HEMOSTASIS) IMPLANT
SUT MNCRL AB 4-0 PS2 18 (SUTURE) ×4 IMPLANT
SUT VIC AB 0 CT1 18XCR BRD 8 (SUTURE) ×1 IMPLANT
SUT VIC AB 0 CT1 8-18 (SUTURE) ×2
SUT VIC AB 1 CT1 18XCR BRD 8 (SUTURE) ×2 IMPLANT
SUT VIC AB 1 CT1 8-18 (SUTURE) ×4
SUT VIC AB 2-0 CT2 18 VCP726D (SUTURE) ×2 IMPLANT
SYR 20CC LL (SYRINGE) ×2 IMPLANT
SYR BULB IRRIGATION 50ML (SYRINGE) ×2 IMPLANT
SYR CONTROL 10ML LL (SYRINGE) ×4 IMPLANT
SYR TB 1ML LUER SLIP (SYRINGE) ×2 IMPLANT
TAPE CLOTH SURG 6X10 WHT LF (GAUZE/BANDAGES/DRESSINGS) ×1 IMPLANT
TOWEL OR 17X24 6PK STRL BLUE (TOWEL DISPOSABLE) ×2 IMPLANT
TOWEL OR 17X26 10 PK STRL BLUE (TOWEL DISPOSABLE) ×2 IMPLANT
TRAY FOLEY CATH 16FRSI W/METER (SET/KITS/TRAYS/PACK) ×2 IMPLANT
WATER STERILE IRR 1000ML POUR (IV SOLUTION) ×2 IMPLANT
YANKAUER SUCT BULB TIP NO VENT (SUCTIONS) ×2 IMPLANT

## 2015-03-13 NOTE — Anesthesia Procedure Notes (Signed)
Procedure Name: Intubation Date/Time: 03/13/2015 2:30 PM Performed by: Jacquiline Doe A Pre-anesthesia Checklist: Patient identified, Emergency Drugs available, Suction available, Patient being monitored and Timeout performed Patient Re-evaluated:Patient Re-evaluated prior to inductionOxygen Delivery Method: Circle system utilized Preoxygenation: Pre-oxygenation with 100% oxygen Intubation Type: IV induction and Cricoid Pressure applied Ventilation: Mask ventilation without difficulty Laryngoscope Size: 3 and Mac Grade View: Grade I Tube type: Oral Tube size: 7.5 mm Number of attempts: 1 Airway Equipment and Method: Stylet Placement Confirmation: ETT inserted through vocal cords under direct vision,  positive ETCO2 and breath sounds checked- equal and bilateral Secured at: 21 cm Tube secured with: Tape Dental Injury: Teeth and Oropharynx as per pre-operative assessment

## 2015-03-13 NOTE — Anesthesia Postprocedure Evaluation (Signed)
  Anesthesia Post-op Note  Patient: Danyell E Amadi  Procedure(s) Performed: Procedure(s) with comments: POSTERIOR LUMBAR FUSION 1 LEVEL (Right) - Right sided lumbar 4-5 Transforaminal lumbar interbody fusion with instrumentation and allograft  Patient Location: PACU  Anesthesia Type:General  Level of Consciousness: sedated, patient cooperative and responds to stimulation  Airway and Oxygen Therapy: Patient Spontanous Breathing and Patient connected to nasal cannula oxygen  Post-op Pain: none  Post-op Assessment: Post-op Vital signs reviewed, Patient's Cardiovascular Status Stable, Respiratory Function Stable, Patent Airway, No signs of Nausea or vomiting and Pain level controlled   LLE Sensation: Full sensation RLE Motor Response: Responds to commands, Purposeful movement RLE Sensation: Full sensation      Post-op Vital Signs: Reviewed and stable  Last Vitals:  Filed Vitals:   03/13/15 2000  BP: 93/50  Pulse: 70  Temp: 37.1 C  Resp: 15    Complications: No apparent anesthesia complications

## 2015-03-13 NOTE — Anesthesia Preprocedure Evaluation (Signed)
Anesthesia Evaluation  Patient identified by MRN, date of birth, ID band Patient awake    Reviewed: Allergy & Precautions, NPO status , Patient's Chart, lab work & pertinent test results  Airway Mallampati: II  TM Distance: >3 FB Neck ROM: Full    Dental no notable dental hx.    Pulmonary Current Smoker,  breath sounds clear to auscultation  Pulmonary exam normal       Cardiovascular negative cardio ROS Normal cardiovascular examRhythm:Regular Rate:Normal     Neuro/Psych negative neurological ROS  negative psych ROS   GI/Hepatic Neg liver ROS, GERD-  ,  Endo/Other  negative endocrine ROS  Renal/GU negative Renal ROS  negative genitourinary   Musculoskeletal negative musculoskeletal ROS (+)   Abdominal   Peds negative pediatric ROS (+)  Hematology negative hematology ROS (+)   Anesthesia Other Findings   Reproductive/Obstetrics negative OB ROS                             Anesthesia Physical Anesthesia Plan  ASA: II  Anesthesia Plan: General   Post-op Pain Management:    Induction: Intravenous  Airway Management Planned: Oral ETT  Additional Equipment:   Intra-op Plan:   Post-operative Plan: Extubation in OR  Informed Consent: I have reviewed the patients History and Physical, chart, labs and discussed the procedure including the risks, benefits and alternatives for the proposed anesthesia with the patient or authorized representative who has indicated his/her understanding and acceptance.   Dental advisory given  Plan Discussed with: CRNA and Surgeon  Anesthesia Plan Comments:         Anesthesia Quick Evaluation

## 2015-03-13 NOTE — Transfer of Care (Signed)
Immediate Anesthesia Transfer of Care Note  Patient: Sheena Young  Procedure(s) Performed: Procedure(s) with comments: POSTERIOR LUMBAR FUSION 1 LEVEL (Right) - Right sided lumbar 4-5 Transforaminal lumbar interbody fusion with instrumentation and allograft  Patient Location: PACU  Anesthesia Type:General  Level of Consciousness: awake, alert  and oriented  Airway & Oxygen Therapy: Patient Spontanous Breathing and Patient connected to nasal cannula oxygen  Post-op Assessment: Report given to RN and Post -op Vital signs reviewed and stable  Post vital signs: Reviewed and stable  Last Vitals:  Filed Vitals:   03/13/15 1045  BP: 105/69  Pulse: 77  Temp: 36.8 C  Resp: 20    Complications: No apparent anesthesia complications

## 2015-03-13 NOTE — H&P (Signed)
PREOPERATIVE H&P  Chief Complaint: Right leg pain  HPI: Sheena Young is a 39 y.o. 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 Patient is s/p an L4/5 decompression over 4 months ago and did well initially, but went on to have a recurrence of her right leg pain.   Patient has failed multiple forms of conservative care and continues to have pain (see office notes for additional details regarding the patient's full course of treatment)  Past Medical History  Diagnosis Date  . Gastritis, acute   . Gastritis     Chronic   . Insomnia   . History of migraine     last one about 68months  . Headache(784.0)     occaionally  . Chronic back pain   . Joint pain   . Joint swelling   . Vomiting   . GERD (gastroesophageal reflux disease)     takes Zantac prn  . Overactive bladder     never filled prescription bc unable to afford  . Urinary urgency   . Urinary frequency   . Shortness of breath dyspnea     with anxiety  . Anxiety     but doesn't take any meds for this- ? panic attacks- not diagnosised   Past Surgical History  Procedure Laterality Date  . Peridontal surgery    . Ectopic pregnancy surgery    . Lumbar laminectomy/decompression microdiscectomy Right 11/03/2012    Procedure: LUMBAR LAMINECTOMY/DECOMPRESSION MICRODISCECTOMY  Right sided lumbar 4-5 microdisectomy;  Surgeon: Sinclair Ship, MD;  Location: [place];  Service: Orthopedics;  Laterality: Right;  Right sided lumbar 4-5 microdisectomy  . Colonoscopy w/ polypectomy     Social History   Social History  . Marital Status: Married    Spouse Name: N/A  . Number of Children: N/A  . Years of Education: N/A   Social History Main Topics  . Smoking status: Former Smoker -- 0.25 packs/day for 8 years    Types: E-cigarettes    Quit date: 03/11/2015  . Smokeless tobacco: Never Used     Comment: of electronic cigarette  . Alcohol Use: No     Comment:  occasionally  . Drug Use: Yes    Special: Marijuana     Comment: 03/12/15- this am  . Sexual Activity: Yes   Other Topics Concern  . Not on file   Social History Narrative   Family History  Problem Relation Age of Onset  . Colon polyps Neg Hx   . Colon cancer Neg Hx   . Liver disease Neg Hx   . Kidney disease Neg Hx   . Cervical cancer Sister   . Diabetes Maternal Grandmother   . Congestive Heart Failure Maternal Grandmother   . Diabetes Maternal Aunt     x 3  . Diabetes Maternal Uncle     x 3  . Heart attack Paternal Grandfather   . Heart disease Maternal Uncle    No Active Allergies Prior to Admission medications   Medication Sig Start Date End Date Taking? Authorizing Provider  cyclobenzaprine (FLEXERIL) 10 MG tablet Take 1/2 or 1 BID as needed Patient taking differently: Take 5-10 mg by mouth 2 (two) times daily as needed for muscle spasms. . 01/03/15   Posey Boyer, MD  gabapentin (NEURONTIN) 100 MG capsule Take 300 mg by mouth 3 (three) times daily.     Historical Provider, MD  HYDROcodone-acetaminophen (NORCO/VICODIN) 5-325 MG  per tablet Take 1 tablet by mouth 3 (three) times daily as needed for moderate pain or severe pain.  03/08/15   Historical Provider, MD  ondansetron (ZOFRAN) 4 MG tablet Take 4 mg by mouth every 4 (four) hours as needed for nausea or vomiting.    Historical Provider, MD  Simethicone (GAS-X PO) Take 1 tablet by mouth as needed (flatulence).     Historical Provider, MD     All other systems have been reviewed and were otherwise negative with the exception of those mentioned in the HPI and as above.  Physical Exam: There were no vitals filed for this visit.  General: Alert, no acute distress Cardiovascular: No pedal edema Respiratory: No cyanosis, no use of accessory musculature Skin: No lesions in the area of chief complaint Neurologic: Sensation intact distally Psychiatric: Patient is competent for consent with normal mood and  affect Lymphatic: No axillary or cervical lymphadenopathy  MUSCULOSKELETAL: + SLR on right  Assessment/Plan: Right leg pain Plan for Procedure(s): POSTERIOR LUMBAR FUSION 1 LEVEL   Sinclair Ship, MD 03/13/2015 6:49 AM

## 2015-03-14 HISTORY — PX: OTHER SURGICAL HISTORY: SHX169

## 2015-03-14 MED FILL — Sodium Chloride IV Soln 0.9%: INTRAVENOUS | Qty: 1000 | Status: AC

## 2015-03-14 MED FILL — Sodium Chloride Irrigation Soln 0.9%: Qty: 3000 | Status: AC

## 2015-03-14 MED FILL — Heparin Sodium (Porcine) Inj 1000 Unit/ML: INTRAMUSCULAR | Qty: 30 | Status: AC

## 2015-03-14 NOTE — Progress Notes (Signed)
Pt doing well. Pt and family given D/C instructions with Rx's, verbal understanding was provided. Pt's incision is clean and dry with no sign of infection. Pt's IV was removed prior to D/C. Pt D/C'd home via wheelchair @ 0945 per MD order. Pt is stable @ D/C and has no other needs at this time. Holli Humbles, RN

## 2015-03-14 NOTE — Progress Notes (Signed)
    Patient doing well Patient previous R leg pain resolved Has been ambulating   Physical Exam: Filed Vitals:   03/14/15 0400  BP: 94/55  Pulse: 73  Temp: 98.2 F (36.8 C)  Resp: 18   Patient looks excellent Dressing in place NVI  POD #1 s/p L4/5 revision decompression and fusion, doing well  - up with PT/OT, encourage ambulation - Percocet for pain, Valium for muscle spasms - likely d/c home today  - brace when up and ambulating

## 2015-03-14 NOTE — Evaluation (Signed)
Physical Therapy Evaluation Patient Details Name: Sheena Young MRN: [id number] DOB: February 13, 1976 Today's Date: 03/14/2015   History of Present Illness  L4-5 fusion  Clinical Impression  Patient evaluated by Physical Therapy with no further acute PT needs identified. All education has been completed and the patient has no further questions.  See below for any follow-up Physical Therapy or equipment needs. PT is signing off. Thank you for this referral.     Follow Up Recommendations Outpatient PT  The potential need for Outpatient PT can be addressed at follow-up MD appointments.     Equipment Recommendations  None recommended by PT    Recommendations for Other Services       Precautions / Restrictions Precautions Precautions: Back Precaution Booklet Issued: Yes (comment) Required Braces or Orthoses: Spinal Brace Spinal Brace: Thoracolumbosacral orthotic;Applied in sitting position Restrictions Weight Bearing Restrictions: No      Mobility  Bed Mobility Overal bed mobility: Needs Assistance Bed Mobility: Rolling;Sidelying to Sit Rolling: Supervision Sidelying to sit: Supervision       General bed mobility comments: Pt did not demonstrate bed mobility, but she did describe log roll technique well  Transfers Overall transfer level: Needs assistance Equipment used: None Transfers: Sit to/from Stand Sit to Stand: Supervision         General transfer comment: Cues for precautions, and to self-monitor  Ambulation/Gait Ambulation/Gait assistance: Supervision;Modified independent (Device/Increase time) Ambulation Distance (Feet): 300 Feet Assistive device: Rolling walker (2 wheeled) Gait Pattern/deviations: Step-through pattern;Decreased stride length     General Gait Details: Supervision progressing to modified independent; adjusted RW for optimal fit; managing well  Stairs            Wheelchair Mobility    Modified Rankin (Stroke Patients Only)        Balance Overall balance assessment: No apparent balance deficits (not formally assessed)                                           Pertinent Vitals/Pain Pain Assessment: 0-10 Pain Score: 8  Pain Location: back  (reports nausea as well) Pain Descriptors / Indicators: Aching Pain Intervention(s): Monitored during session (RN gave meds for nausea)    Home Living Family/patient expects to be discharged to:: Private residence Living Arrangements: Spouse/significant other Available Help at Discharge: Family;Available 24 hours/day Type of Home: House Home Access: Stairs to enter   [employer] of Steps: 1 Home Layout: One level Home Equipment: Walker - 2 wheels;Cane - single point;Bedside commode;Tub bench;Hand held shower head      Prior Function Level of Independence: Independent with assistive device(s) (using RW at times due to back pain)               Hand Dominance        Extremity/Trunk Assessment   Upper Extremity Assessment: Overall WFL for tasks assessed           Lower Extremity Assessment: Overall WFL for tasks assessed      Cervical / Trunk Assessment: Normal  Communication      Cognition Arousal/Alertness: Awake/alert Behavior During Therapy: WFL for tasks assessed/performed Overall Cognitive Status: Within Functional Limits for tasks assessed                      General Comments      Exercises        Assessment/Plan  PT Assessment All further PT needs can be met in the next venue of care  PT Diagnosis Acute pain   PT Problem List Pain  PT Treatment Interventions     PT Goals (Current goals can be found in the Care Plan section) Acute Rehab PT Goals Patient Stated Goal: to go home PT Goal Formulation: All assessment and education complete, DC therapy    Frequency     Barriers to discharge        Co-evaluation               End of Session Equipment Utilized During  Treatment: Back brace Activity Tolerance: Patient tolerated treatment well Patient left: in chair;with call bell/phone within reach Nurse Communication: Mobility status         Time: 0846-0901 PT Time Calculation (min) (ACUTE ONLY): 15 min   Charges:   PT Evaluation $Initial PT Evaluation Tier I: 1 Procedure     PT G Codes:        Garrigan, Holly Hamff 03/14/2015, 10:33 AM  Holly Garrigan, PT  Acute Rehabilitation Services Pager [phone] Office [phone]  

## 2015-03-14 NOTE — Progress Notes (Signed)
Occupational Therapy Evaluation Patient Details Name: ONDRIA OSWALD MRN: [id number] DOB: 12-20-1975 Today's Date: 03/14/2015    History of Present Illness L4-5 fusion   Clinical Impression   Completed all education regarding compensatory techniques and use of AE/DME to maximize functional level of independence and safety to facilitate safe return home with intermittent S. Making excellent progress. Ready to D/C home when medically stable. OT signing off. Written handouts given.    Follow Up Recommendations  No OT follow up;Supervision - Intermittent    Equipment Recommendations  None recommended by OT    Recommendations for Other Services       Precautions / Restrictions Precautions Precautions: Back Precaution Booklet Issued: Yes (comment) Required Braces or Orthoses: Spinal Brace Spinal Brace: Thoracolumbosacral orthotic;Applied in sitting position Restrictions Weight Bearing Restrictions: No      Mobility Bed Mobility Overal bed mobility: Needs Assistance Bed Mobility: Rolling;Sidelying to Sit Rolling: Supervision Sidelying to sit: Supervision       General bed mobility comments: vc for technique  Transfers Overall transfer level: Needs assistance   Transfers: Sit to/from Stand Sit to Stand: Supervision              Balance Overall balance assessment: No apparent balance deficits (not formally assessed) (using RW for stability longer distances)                                          ADL Overall ADL's : Needs assistance/impaired     Grooming: Supervision/safety;Standing   Upper Body Bathing: Supervision/ safety;Set up;Sitting   Lower Body Bathing: Set up;Supervison/ safety;Sit to/from stand   Upper Body Dressing : Supervision/safety;Set up;Sitting  Able to donn/doff brace @ mod I level   Lower Body Dressing: Set up;Supervision/safety;Sit to/from stand   Toilet Transfer: Supervision/safety   Toileting- Marine scientist and Hygiene: Supervision/safety;Set up [place] Details (indicate cue type and reason): Educated on AE for hygiene after toileting Tub/ Shower Transfer: Adhering to back precautions   Functional mobility during ADLs: Supervision/safety;Cueing for safety General ADL Comments: Completed education regarding back precautions and functional mobility for ADL. Discussed home safety and set up  to adhere to following back precautions  Emphasized importance of adhering to back precautions and not "overdoing it". Pt verbalized understanding.      Vision     Perception     Praxis      Pertinent Vitals/Pain Pain Assessment: 0-10 Pain Score: 7  Pain Location: back Pain Descriptors / Indicators: Aching Pain Intervention(s): Limited activity within patient's tolerance;Premedicated before session;Monitored during session     Hand Dominance     Extremity/Trunk Assessment Upper Extremity Assessment Upper Extremity Assessment: Overall WFL for tasks assessed   Lower Extremity Assessment Lower Extremity Assessment: Overall WFL for tasks assessed   Cervical / Trunk Assessment Cervical / Trunk Assessment: Normal   Communication     Cognition Arousal/Alertness: Awake/alert Behavior During Therapy: WFL for tasks assessed/performed Overall Cognitive Status: Within Functional Limits for tasks assessed                     General Comments       Exercises       Shoulder Instructions      Home Living Family/patient expects to be discharged to:: Private residence Living Arrangements: Spouse/significant other Available Help at Discharge: Family;Available 24 hours/day Type of Home: House Home Access: Stairs  to enter Entrance Stairs-Number of Steps: 1   Home Layout: One level     Bathroom Shower/Tub: Tub/shower unit Shower/tub characteristics: Architectural technologist: Standard Bathroom Accessibility: No   Home Equipment: Environmental consultant - 2  wheels;Cane - single point;Bedside commode;Tub bench;Hand held shower head          Prior Functioning/Environment Level of Independence: Independent with assistive device(s) (using RW at times due to back pain)             OT Diagnosis: Generalized weakness;Acute pain   OT Problem List: Decreased activity tolerance;Decreased knowledge of use of DME or AE;Decreased knowledge of precautions;Pain   OT Treatment/Interventions:      OT Goals(Current goals can be found in the care plan section) Acute Rehab OT Goals Patient Stated Goal: to go home OT Goal Formulation: With patient  OT Frequency:     Barriers to D/C:            Co-evaluation              End of Session Equipment Utilized During Treatment: Gait belt;Back brace Nurse Communication: Mobility status  Activity Tolerance: Patient tolerated treatment well Patient left: in chair;with call bell/phone within reach   Time: 0805-0840 OT Time Calculation (min): 35 min Charges:  OT General Charges $OT Visit: 1 Procedure OT Evaluation $Initial OT Evaluation Tier I: 1 Procedure OT Treatments $Self Care/Home Management : 8-22 mins G-Codes:    Laquia Rosano,HILLARY 31-Mar-2015, 9:03 AM   Maurie Boettcher, OTR/L  [phone] 03/31/2015

## 2015-03-14 NOTE — Op Note (Signed)
NAMEVUNG, KUSH NO.:  [id number]  MEDICAL RECORD NO.:  [id number]  LOCATION:  [id number]                        FACILITY:  [facility]  PHYSICIAN:  Phylliss Bob, MD      DATE OF BIRTH:  1976-02-05  DATE OF PROCEDURE:  03/13/2015                              OPERATIVE REPORT   PREOPERATIVE DIAGNOSES: 1. 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 2. Right-sided L5 radiculopathy.  POSTOPERATIVE DIAGNOSES: 1. 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 2. Right-sided L5 radiculopathy.  PROCEDURE: 1. Right-sided L4-5 transforaminal lumbar interbody fusion. 2. Left-sided L4-5 posterolateral fusion. 3. Revision L4-5 decompression, requiring substantially more bone and     soft tissue removal then that of which is required for the simple     interbody fusion portion of the procedure. 4. Insertion of interbody device x1 (9 x 27 mm Concorde bullet cage). 5. Placement of posterior instrumentation (6 x 35 mm cortical screws). 6. Use of local autograft. 7. Use of morselized allograft-DBX mix. 8. Intraoperative use of fluoroscopy.  SURGEON:  Phylliss Bob, MD  ASSISTANT:  Pricilla Holm, PA-C  ANESTHESIA:  General endotracheal anesthesia.  COMPLICATIONS:  None.  DISPOSITION:  Stable.  ESTIMATED BLOOD LOSS:  100 mL.  INDICATIONS FOR SURGERY:  Briefly, Ms. Meeker is a very pleasant 39- year-old female, who is status post a previous L4-5 decompressive procedure.  The patient did extremely well from that surgery, but did go on to have recurrence of right leg pain.  An updated MRI did reveal a recurrent right L4-5 disk herniation.  The patient did fail multiple forms of conservative care and did continue to have pain.  We therefore did discuss proceeding with the procedure reflected above.  OPERATIVE DETAILS:  On March 13, 2015, the patient was brought to surgery and  general endotracheal anesthesia was administered.  The patient was placed prone on a well-padded flat Jackson bed with a spinal frame.  Antibiotics were given and a time-out procedure was performed. After prepping and draping the back, midline incision was made in line with the patient's previous incision which was extended slightly superiorly.  Using anatomic landmarks in addition to AP and lateral fluoroscopy, I did Styles Fambro out the starting points of the L4 and L5 pedicle screws.  On the left side, I did decorticate the posterior elements and the posterolateral gutter.  I then placed 6 x 35 mm pedicle screws in a medial to lateral trajectory.  A 35 mm rod was placed and distraction was applied across the rod and caps were placed and provisionally tightened.  On the right side, after cannulating the pedicle holes, I did place bone wax in the cannulated pedicles.  I then proceeded with a full facetectomy on the right side at L4-5.  The exiting L4 nerve and the traversing L5 nerve were readily noted.  With an assistant holding medial retraction on the traversing L5 nerve, I did perform a thorough and complete intervertebral L4-5 diskectomy.  Of note, there was noted to be a prominent disk protrusion on the right side, compressing the right L5 nerve.  However, the protrusion  was adequately removed, thereby decompressing the right L5 nerve.  I then appropriately prepared the endplates.  The intervertebral space was then packed with allograft and autograft, as well as the appropriate size intervertebral spacer.  The spacer was then tamped into position in the usual fashion.  I was very pleased with the press fit of the implant.  I then removed the distraction on the left side by loosening the caps.  Autograft and allograft were then packed in the posterolateral gutter on the left. The caps on the left were then tightened and final locking procedure was performed.  On the right side, pedicle  screws were also placed in the manner previously described.  A 35 mm rod was placed and caps were placed and a final locking procedure was again performed.  Prior to placing the bone graft, I did liberally irrigate the wound with approximately 2 L of normal saline.  Bleeding was then controlled.  I was very pleased with the final AP and lateral fluoroscopic images.  The wound was then closed in layers using #1 Vicryl followed by 2-0 Vicryl, followed by 3-0 Monocryl.  Benzoin and Steri-Strips were applied followed by sterile dressing.  All instrument counts were correct at the termination of the procedure.  Of note, Pricilla Holm was my assistant throughout surgery, and did aid in retraction, suctioning, and closure throughout the surgery.  Also of note, I did use neurologic monitoring throughout the procedure, and there was no abnormal EMG activity noted throughout the entire surgery from start to finish.     Phylliss Bob, MD     MD/MEDQ  D:  03/13/2015  T:  03/14/2015  Job:  [id number]

## 2015-03-18 ENCOUNTER — Telehealth: Payer: Self-pay

## 2015-03-18 NOTE — Telephone Encounter (Signed)
Patient has been discharged from the hospital and is out of pain medication. She called in to have a refill on the medicine and they told her that they couldn't fill it for her and she would have to contact her primary care physician to get a refill. States that she's in a lot of pain and needs the medicine. Says Dr. Lorelei Pont is her PCP.

## 2015-03-19 NOTE — Telephone Encounter (Signed)
Called pt back- she was able to get pain medication from her surgeon's office.  She is feeling a lot better now.  This is great

## 2015-03-19 NOTE — Telephone Encounter (Signed)
Patient really wants a return call from Dr. Lorelei Pont. She was crying during our conversation so I didn't understand much other than Hydrocodone and back surgery.   [phone]

## 2015-03-19 NOTE — Telephone Encounter (Signed)
She has to come in for an OV, correct?

## 2015-03-28 NOTE — Discharge Summary (Signed)
Patient ID: Sheena Young MRN: [id number] DOB/AGE: November 04, 1975 39 y.o.  Admit date: 03/13/2015 Discharge date: 03/14/2015  Admission Diagnoses:  Active Problems:   Radiculopathy   Discharge Diagnoses:  Same  Past Medical History  Diagnosis Date  . Gastritis, acute   . Gastritis     Chronic   . Insomnia   . History of migraine     last one about 45months  . Headache(784.0)     occaionally  . Chronic back pain   . Joint pain   . Joint swelling   . Vomiting   . GERD (gastroesophageal reflux disease)     takes Zantac prn  . Overactive bladder     never filled prescription bc unable to afford  . Urinary urgency   . Urinary frequency   . Shortness of breath dyspnea     with anxiety  . Anxiety     but doesn't take any meds for this- ? panic attacks- not diagnosised    Surgeries: Procedure(s): POSTERIOR LUMBAR FUSION 1 LEVEL L4-5 on 03/13/2015   Consultants:  None  Discharged Condition: Improved  Hospital Course: Sheena Young is an 39 y.o. female who was admitted 03/13/2015 for operative treatment of radiculopathy. Patient has severe unremitting pain that affects sleep, daily activities, and work/hobbies. After pre-op clearance the patient was taken to the operating room on 03/13/2015 and underwent  Procedure(s): POSTERIOR LUMBAR FUSION 1 LEVEL L4-5.    Patient was given perioperative antibiotics:  Anti-infectives    Start     Dose/Rate Route Frequency Ordered Stop   03/14/15 0200  ceFAZolin (ANCEF) IVPB 1 g/50 mL premix  Status:  Discontinued     1 g 100 mL/hr over 30 Minutes Intravenous Every 8 hours 03/13/15 1956 03/14/15 1247   03/13/15 1300  ceFAZolin (ANCEF) IVPB 2 g/50 mL premix     2 g 100 mL/hr over 30 Minutes Intravenous To [facility] 03/12/15 1248 03/13/15 1831       Patient was given sequential compression devices, early ambulation to prevent DVT.  Patient benefited maximally from hospital stay and there were no complications.     Recent vital signs: BP 105/62 mmHg  Pulse 73  Temp(Src) 97.9 F (36.6 C) (Oral)  Resp 18  Ht 5' (1.524 m)  Wt 83.065 kg (183 lb 2 oz)  BMI 35.76 kg/m2  SpO2 95%  LMP 02/17/2015  Discharge Medications:     Medication List    STOP taking these medications        gabapentin 300 MG capsule  Commonly known as:  NEURONTIN      TAKE these medications        GAS-X PO  Take 1 tablet by mouth as needed (flatulence).     ondansetron 4 MG tablet  Commonly known as:  ZOFRAN  Take 4 mg by mouth every 4 (four) hours as needed for nausea or vomiting.     PROBIOTIC DAILY PO  Take 1 tablet by mouth daily.        Diagnostic Studies: Dg Chest 2 View  03/13/2015   CLINICAL DATA:  Preop for L-spine today. No recent chest complaints. History of smoking. Family history of heart problems.  EXAM: CHEST  2 VIEW  COMPARISON:  07/12/2013, 11/01/2012  FINDINGS: Shallow lung inflation. Heart size is normal. No focal consolidations. No pleural effusions or pulmonary edema. Visualized osseous structures have a normal appearance.  IMPRESSION: No active cardiopulmonary disease.   Electronically Signed   By: Benjamine Mola  Owens Shark M.D.   On: 03/13/2015 11:57   Dg Lumbar Spine 2-3 Views  03/13/2015   CLINICAL DATA:  Intraoperative fluoroscopic images from lumbosacral fusion.  EXAM: DG C-ARM 61-120 MIN; LUMBAR SPINE - 2-3 VIEW  COMPARISON:  03/13/2015  FINDINGS: Two fluoroscopic images are provided for interpretation demonstrating anterior and posterior L4-L5 fusion. Intrapedicular screws are in good position. No evidence of immediate complications.  Fluoroscopy Time:  43 seconds.  Number of Acquired Images:  2  IMPRESSION: Status post lower lumbosacral spine fusion, intraoperative images.   Electronically Signed   By: Fidela Salisbury M.D.   On: 03/13/2015 18:58   Dg Lumbar Spine 2-3 Views  03/13/2015   CLINICAL DATA:  Right-sided L4-L5 transforaminal lumbar interbody fusion.  EXAM: LUMBAR SPINE - 2-3 VIEW   COMPARISON:  MRI in of the lumbar spine dated 02/06/2015  FINDINGS: There is no evidence of lumbar spine fracture. Alignment is normal. L4-L5 and L5-S1 disc space narrowing is seen. Surgical instruments overly the posterior soft tissues at the level of L3-L4 and L4-L5 intervertebral spaces.  IMPRESSION: Intraoperative lateral lumbosacral spine radiograph with instruments at the level of L3-L4 and L4-L5, overlying the posterior spinal soft tissues.   Electronically Signed   By: Fidela Salisbury M.D.   On: 03/13/2015 16:44   Dg C-arm 1-60 Min  03/13/2015   CLINICAL DATA:  Intraoperative fluoroscopic images from lumbosacral fusion.  EXAM: DG C-ARM 61-120 MIN; LUMBAR SPINE - 2-3 VIEW  COMPARISON:  03/13/2015  FINDINGS: Two fluoroscopic images are provided for interpretation demonstrating anterior and posterior L4-L5 fusion. Intrapedicular screws are in good position. No evidence of immediate complications.  Fluoroscopy Time:  43 seconds.  Number of Acquired Images:  2  IMPRESSION: Status post lower lumbosacral spine fusion, intraoperative images.   Electronically Signed   By: Fidela Salisbury M.D.   On: 03/13/2015 18:58    Disposition: 01-Home or Self Care   POD #1 s/p L4/5 revision decompression and fusion, doing well  - up with PT/OT, encourage ambulation - Percocet for pain, Valium for muscle spasms - brace when up and ambulating -Written scripts for pain signed and in chart -D/C instructions sheet printed and in chart -D/C today  -F/U in office 2 weeks   Signed: Justice Britain 03/28/2015, 1:12 PM

## 2015-04-04 ENCOUNTER — Encounter: Payer: Self-pay | Admitting: Family Medicine

## 2015-07-10 ENCOUNTER — Telehealth: Payer: Self-pay

## 2015-07-10 NOTE — Telephone Encounter (Signed)
Spoke with pt advised to come in. Pt understood.

## 2015-07-10 NOTE — Telephone Encounter (Addendum)
Pt states her nephew is sick with flu-like symptoms and she is now having the same thing. Would like the Dr to call her something in. Please call [place]

## 2015-07-12 ENCOUNTER — Telehealth: Payer: Self-pay

## 2015-07-12 ENCOUNTER — Ambulatory Visit (INDEPENDENT_AMBULATORY_CARE_PROVIDER_SITE_OTHER): Payer: 59

## 2015-07-12 ENCOUNTER — Ambulatory Visit (INDEPENDENT_AMBULATORY_CARE_PROVIDER_SITE_OTHER): Payer: 59 | Admitting: Family Medicine

## 2015-07-12 VITALS — BP 98/80 | HR 91 | Temp 98.1°F | Resp 18 | Ht 64.0 in | Wt 183.4 lb

## 2015-07-12 DIAGNOSIS — J988 Other specified respiratory disorders: Secondary | ICD-10-CM | POA: Diagnosis not present

## 2015-07-12 DIAGNOSIS — R05 Cough: Secondary | ICD-10-CM | POA: Diagnosis not present

## 2015-07-12 DIAGNOSIS — J22 Unspecified acute lower respiratory infection: Secondary | ICD-10-CM

## 2015-07-12 DIAGNOSIS — R058 Other specified cough: Secondary | ICD-10-CM

## 2015-07-12 LAB — POCT URINE PREGNANCY: Preg Test, Ur: NEGATIVE

## 2015-07-12 MED ORDER — HYDROCOD POLST-CPM POLST ER 10-8 MG/5ML PO SUER: 5.0000 mL | Freq: Every evening | ORAL | Status: DC | PRN

## 2015-07-12 MED ORDER — AZITHROMYCIN 250 MG PO TABS: ORAL_TABLET | ORAL | Status: DC

## 2015-07-12 MED ORDER — GUAIFENESIN ER 1200 MG PO TB12: 1.0000 | ORAL_TABLET | Freq: Two times a day (BID) | ORAL | Status: DC | PRN

## 2015-07-12 NOTE — Patient Instructions (Signed)
Please hydrate well with 64 oz of water per day. You can use delsym over the counter for day time cough.  Community-Acquired Pneumonia, Adult Pneumonia is an infection of the lungs. There are different types of pneumonia. One type can develop while a person is in a hospital. A different type, called community-acquired pneumonia, develops in people who are not, or have not recently been, in the hospital or other health care facility.  CAUSES Pneumonia may be caused by bacteria, viruses, or funguses. Community-acquired pneumonia is often caused by Streptococcus pneumonia bacteria. These bacteria are often passed from one person to another by breathing in droplets from the cough or sneeze of an infected person. RISK FACTORS The condition is more likely to develop in:  People who havechronic diseases, such as chronic obstructive pulmonary disease (COPD), asthma, congestive heart failure, cystic fibrosis, diabetes, or kidney disease.  People who haveearly-stage or late-stage HIV.  People who havesickle cell disease.  People who havehad their spleen removed (splenectomy).  People who havepoor Human resources officer.  People who havemedical conditions that increase the risk of breathing in (aspirating) secretions their own mouth and nose.   People who havea weakened immune system (immunocompromised).  People who smoke.  People whotravel to areas where pneumonia-causing germs commonly exist.  People whoare around animal habitats or animals that have pneumonia-causing germs, including birds, bats, rabbits, cats, and farm animals. SYMPTOMS Symptoms of this condition include:  Adry cough.  A wet (productive) cough.  Fever.  Sweating.  Chest pain, especially when breathing deeply or coughing.  Rapid breathing or difficulty breathing.  Shortness of breath.  Shaking chills.  Fatigue.  Muscle aches. DIAGNOSIS Your health care provider will take a medical history and perform a  physical exam. You may also have other tests, including:  Imaging studies of your chest, including X-rays.  Tests to check your blood oxygen level and other blood gases.  Other tests on blood, mucus (sputum), fluid around your lungs (pleural fluid), and urine. If your pneumonia is severe, other tests may be done to identify the specific cause of your illness. TREATMENT The type of treatment that you receive depends on many factors, such as the cause of your pneumonia, the medicines you take, and other medical conditions that you have. For most adults, treatment and recovery from pneumonia may occur at home. In some cases, treatment must happen in a hospital. Treatment may include:  Antibiotic medicines, if the pneumonia was caused by bacteria.  Antiviral medicines, if the pneumonia was caused by a virus.  Medicines that are given by mouth or through an IV tube.  Oxygen.  Respiratory therapy. Although rare, treating severe pneumonia may include:  Mechanical ventilation. This is done if you are not breathing well on your own and you cannot maintain a safe blood oxygen level.  Thoracentesis. This procedureremoves fluid around one lung or both lungs to help you breathe better. HOME CARE INSTRUCTIONS  Take over-the-counter and prescription medicines only as told by your health care provider.  Only takecough medicine if you are losing sleep. Understand that cough medicine can prevent your body's natural ability to remove mucus from your lungs.  If you were prescribed an antibiotic medicine, take it as told by your health care provider. Do not stop taking the antibiotic even if you start to feel better.  Sleep in a semi-upright position at night. Try sleeping in a reclining chair, or place a few pillows under your head.  Do not use tobacco products, including cigarettes,  chewing tobacco, and e-cigarettes. If you need help quitting, ask your health care provider.  Drink enough water to  keep your urine clear or pale yellow. This will help to thin out mucus secretions in your lungs. PREVENTION There are ways that you can decrease your risk of developing community-acquired pneumonia. Consider getting a pneumococcal vaccine if:  You are older than 37 years of age.  You are older than 37 years of age and are undergoing cancer treatment, have chronic lung disease, or have other medical conditions that affect your immune system. Ask your health care provider if this applies to you. There are different types and schedules of pneumococcal vaccines. Ask your health care provider which vaccination option is best for you. You may also prevent community-acquired pneumonia if you take these actions:  Get an influenza vaccine every year. Ask your health care provider which type of influenza vaccine is best for you.  Go to the dentist on a regular basis.  Wash your hands often. Use hand sanitizer if soap and water are not available. SEEK MEDICAL CARE IF:  You have a fever.  You are losing sleep because you cannot control your cough with cough medicine. SEEK IMMEDIATE MEDICAL CARE IF:  You have worsening shortness of breath.  You have increased chest pain.  Your sickness becomes worse, especially if you are an older adult or have a weakened immune system.  You cough up blood.   This information is not intended to replace advice given to you by your health care provider. Make sure you discuss any questions you have with your health care provider.   Document Released: 06/29/2005 Document Revised: 03/20/2015 Document Reviewed: 10/24/2014 Elsevier Interactive Patient Education [employer].

## 2015-07-12 NOTE — Telephone Encounter (Signed)
Pts husband called and said that his wife was diagnosed with strep and he also has it. He would like for a prescription to be called in for her if that is possible.  Please advise her husband  [phone]

## 2015-07-12 NOTE — Telephone Encounter (Signed)
Pt is here

## 2015-07-12 NOTE — Progress Notes (Signed)
[facility] [address] [place] [address] [phone]  Date:  07/12/2015   Name:  Sheena Young   DOB:  [birth year]   MRN:  [id number]  PCP:  No PCP Per Patient    History of Present Illness:  Sheena Young is a 39 y.o. female patient who presents to [facility] for chief complaint of 2 weeks of throat pain, and cough.  Cough is productive a clear liquid and mucus.  No sob or dyspnea.  She feels winded with normal activities.  She has had a fever, which she is taking aleve.  She is feeling better.   No GI symptoms.  Patient Active Problem List   Diagnosis Date Noted  . Radiculopathy 03/13/2015    Past Medical History  Diagnosis Date  . Gastritis, acute   . Gastritis     Chronic   . Insomnia   . History of migraine     last one about 22months  . Headache(784.0)     occaionally  . Chronic back pain   . Joint pain   . Joint swelling   . Vomiting   . GERD (gastroesophageal reflux disease)     takes Zantac prn  . Overactive bladder     never filled prescription bc unable to afford  . Urinary urgency   . Urinary frequency   . Shortness of breath dyspnea     with anxiety  . Anxiety     but doesn't take any meds for this- ? panic attacks- not diagnosised    Past Surgical History  Procedure Laterality Date  . Peridontal surgery    . Ectopic pregnancy surgery    . Lumbar laminectomy/decompression microdiscectomy Right 11/03/2012    Procedure: LUMBAR LAMINECTOMY/DECOMPRESSION MICRODISCECTOMY  Right sided lumbar 4-5 microdisectomy;  Surgeon: Sinclair Ship, MD;  Location: [facility];  Service: Orthopedics;  Laterality: Right;  Right sided lumbar 4-5 microdisectomy  . Colonoscopy w/ polypectomy    . Revision decompression  03/2015    decompression and fusion at L4/5, [facility]    Social History  Substance Use Topics  . Smoking status: Former Smoker -- 0.25 packs/day for 8 years    Types: E-cigarettes    Quit date: 03/11/2015  . Smokeless  tobacco: Never Used     Comment: of electronic cigarette  . Alcohol Use: No     Comment: occasionally    Family History  Problem Relation Age of Onset  . Colon polyps Neg Hx   . Colon cancer Neg Hx   . Liver disease Neg Hx   . Kidney disease Neg Hx   . Cervical cancer Sister   . Diabetes Maternal Grandmother   . Congestive Heart Failure Maternal Grandmother   . Diabetes Maternal Aunt     x 3  . Diabetes Maternal Uncle     x 3  . Heart attack Paternal Grandfather   . Heart disease Maternal Uncle     No Known Allergies  Medication list has been reviewed and updated.  Current Outpatient Prescriptions on File Prior to Visit  Medication Sig Dispense Refill  . ondansetron (ZOFRAN) 4 MG tablet Take 4 mg by mouth every 4 (four) hours as needed for nausea or vomiting. Reported on 07/12/2015    . Probiotic Product (PROBIOTIC DAILY PO) Take 1 tablet by mouth daily. Reported on 07/12/2015    . Simethicone (GAS-X PO) Take 1 tablet by mouth as needed (flatulence). Reported on 07/12/2015  No current facility-administered medications on file prior to visit.    ROS ROS otherwise unremarkable unless lsited above.   Physical Examination: BP 98/80 mmHg  Pulse 91  Temp(Src) 98.1 F (36.7 C) (Oral)  Resp 18  Ht 5\' 4"  (1.626 m)  Wt 183 lb 6.4 oz (83.19 kg)  BMI 31.47 kg/m2  SpO2 98%  LMP 06/14/2015 Ideal Body Weight: Weight in (lb) to have BMI = 25: 145.3  Physical Exam  Constitutional: She is oriented to person, place, and time. She appears well-developed and well-nourished. No distress.  HENT:  Head: Normocephalic and atraumatic.  Right Ear: External ear normal.  Left Ear: External ear normal.  Eyes: Conjunctivae and EOM are normal. Pupils are equal, round, and reactive to light.  Cardiovascular: Normal rate.   Pulmonary/Chest: Effort normal. No apnea. No respiratory distress. She has no decreased breath sounds. She has no wheezes. She has no rhonchi.  Neurological: She is  alert and oriented to person, place, and time.  Skin: She is not diaphoretic.  Psychiatric: She has a normal mood and affect. Her behavior is normal.    [facility] reading (PRIMARY) by  Dr. Linna Darner: Congestive changes of the left lower lobe.   Assessment and Plan: [name] is a 39 y.o. female who is here today for cc of cough and sorethroat for 2 weeks. Lower respiratory infection (e.g., bronchitis, pneumonia, pneumonitis, pulmonitis) - Plan: azithromycin (ZITHROMAX) 250 MG tablet, chlorpheniramine-HYDROcodone (TUSSIONEX PENNKINETIC ER) 10-8 MG/5ML SUER, Guaifenesin (MUCINEX MAXIMUM STRENGTH) 1200 MG TB12  Productive cough - Plan: DG Chest 2 View, POCT urine pregnancy, azithromycin (ZITHROMAX) 250 MG tablet, chlorpheniramine-HYDROcodone (TUSSIONEX PENNKINETIC ER) 10-8 MG/5ML SUER, Guaifenesin (MUCINEX MAXIMUM STRENGTH) 1200 MG TB12   Ivar Drape, PA-C [facility] 07/12/2015 9:36 AM

## 2015-07-13 NOTE — Progress Notes (Signed)
Discussed with Ivar Drape PAC.  Xray appears to have early congestive changes.  Pt seen. Ear still a little inflammed.  Agree with treatment plan.    Sundeep Cary H. Linna Darner MD

## 2015-10-25 ENCOUNTER — Ambulatory Visit (INDEPENDENT_AMBULATORY_CARE_PROVIDER_SITE_OTHER): Payer: BLUE CROSS/BLUE SHIELD | Admitting: Physician Assistant

## 2015-10-25 VITALS — BP 104/74 | HR 77 | Temp 97.9°F | Resp 17 | Ht 64.0 in | Wt 186.0 lb

## 2015-10-25 DIAGNOSIS — H9203 Otalgia, bilateral: Secondary | ICD-10-CM

## 2015-10-25 MED ORDER — AMOXICILLIN 875 MG PO TABS: 875.0000 mg | ORAL_TABLET | Freq: Two times a day (BID) | ORAL | Status: AC

## 2015-10-25 MED ORDER — OFLOXACIN 0.3 % OT SOLN: 5.0000 [drp] | Freq: Every day | OTIC | Status: AC

## 2015-10-25 NOTE — Patient Instructions (Addendum)
Use the Robaxin (methocarbamol) that you have, which may also help.    IF you received an x-ray today, you will receive an invoice from [facility]. Please contact [facility] at [phone] with questions or concerns regarding your invoice.   IF you received labwork today, you will receive an invoice from [employer]. Please contact [facility] at [phone] with questions or concerns regarding your invoice.   Our billing staff will not be able to assist you with questions regarding bills from these companies.  You will be contacted with the lab results as soon as they are available. The fastest way to get your results is to activate your My Chart account. Instructions are located on the last page of this paperwork. If you have not heard from [place] regarding the results in 2 weeks, please contact this office.     Temporomandibular Joint Syndrome Temporomandibular joint (TMJ) syndrome is a condition that affects the joints between your jaw and your skull. The TMJs are located near your ears and allow your jaw to open and close. These joints and the nearby muscles are involved in all movements of the jaw. People with TMJ syndrome have pain in the area of these joints and muscles. Chewing, biting, or other movements of the jaw can be difficult or painful. TMJ syndrome can be caused by various things. In many cases, the condition is mild and goes away within a few weeks. For some people, the condition can become a long-term problem. CAUSES Possible causes of TMJ syndrome include:  Grinding your teeth or clenching your jaw. Some people do this when they are under stress.  Arthritis.  Injury to the jaw.  Head or neck injury.  Teeth or dentures that are not aligned well. In some cases, the cause of TMJ syndrome may not be known. SIGNS AND SYMPTOMS The most common symptom is an aching pain on the side of the head in the area of the TMJ. Other symptoms  may include:  Pain when moving your jaw, such as when chewing or biting.  Being unable to open your jaw all the way.  Making a clicking sound when you open your mouth.  Headache.  Earache.  Neck or shoulder pain. DIAGNOSIS Diagnosis can usually be made based on your symptoms, your medical history, and a physical exam. Your health care provider may check the range of motion of your jaw. Imaging tests, such as X-rays or an MRI, are sometimes done. You may need to see your dentist to determine if your teeth and jaw are lined up correctly. TREATMENT TMJ syndrome often goes away on its own. If treatment is needed, the options may include:  Eating soft foods and applying ice or heat.  Medicines to relieve pain or inflammation.  Medicines to relax the muscles.  A splint, bite plate, or mouthpiece to prevent teeth grinding or jaw clenching.  Relaxation techniques or counseling to help reduce stress.  Transcutaneous electrical nerve stimulation (TENS). This helps to relieve pain by applying an electrical current through the skin.  Acupuncture. This is sometimes helpful to relieve pain.  Jaw surgery. This is rarely needed. HOME CARE INSTRUCTIONS  Take medicines only as directed by your health care provider.  Eat a soft diet if you are having trouble chewing.  Apply ice to the painful area.  Put ice in a plastic bag.  Place a towel between your skin and the bag.  Leave the ice on for 20 minutes, 2-3 times a day.  Apply a warm compress to the painful area as directed.  Massage your jaw area and perform any jaw stretching exercises as recommended by your health care provider.  If you were given a mouthpiece or bite plate, wear it as directed.  Avoid foods that require a lot of chewing. Do not chew gum.  Keep all follow-up visits as directed by your health care provider. This is important. SEEK MEDICAL CARE IF:  You are having trouble eating.  You have new or worsening  symptoms. SEEK IMMEDIATE MEDICAL CARE IF:  Your jaw locks open or closed.   This information is not intended to replace advice given to you by your health care provider. Make sure you discuss any questions you have with your health care provider.   Document Released: 03/24/2001 Document Revised: 07/20/2014 Document Reviewed: 02/01/2014 Elsevier Interactive Patient Education [employer].
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
## 2015-11-29 DIAGNOSIS — M545 Low back pain: Secondary | ICD-10-CM | POA: Diagnosis not present

## 2015-12-06 DIAGNOSIS — M25571 Pain in right ankle and joints of right foot: Secondary | ICD-10-CM | POA: Diagnosis not present

## 2016-01-06 DIAGNOSIS — M79671 Pain in right foot: Secondary | ICD-10-CM | POA: Diagnosis not present

## 2016-06-12 ENCOUNTER — Ambulatory Visit (INDEPENDENT_AMBULATORY_CARE_PROVIDER_SITE_OTHER): Payer: BLUE CROSS/BLUE SHIELD

## 2016-06-12 ENCOUNTER — Ambulatory Visit (INDEPENDENT_AMBULATORY_CARE_PROVIDER_SITE_OTHER): Payer: BLUE CROSS/BLUE SHIELD | Admitting: Physician Assistant

## 2016-06-12 VITALS — BP 132/82 | HR 89 | Temp 98.7°F | Resp 17 | Ht 64.5 in | Wt 183.0 lb

## 2016-06-12 DIAGNOSIS — R0602 Shortness of breath: Secondary | ICD-10-CM | POA: Diagnosis not present

## 2016-06-12 DIAGNOSIS — R05 Cough: Secondary | ICD-10-CM

## 2016-06-12 DIAGNOSIS — R059 Cough, unspecified: Secondary | ICD-10-CM

## 2016-06-12 LAB — POCT CBC
Granulocyte percent: 69.5 %G (ref 37–80)
HEMATOCRIT: 38.2 % (ref 37.7–47.9)
HEMOGLOBIN: 13.5 g/dL (ref 12.2–16.2)
LYMPH, POC: 2.9 (ref 0.6–3.4)
MCH: 33 pg — AB (ref 27–31.2)
MCHC: 35.3 g/dL (ref 31.8–35.4)
MCV: 93.4 fL (ref 80–97)
MID (CBC): 0.6 (ref 0–0.9)
MPV: 7.2 fL (ref 0–99.8)
POC GRANULOCYTE: 8 — AB (ref 2–6.9)
POC LYMPH PERCENT: 25.4 %L (ref 10–50)
POC MID %: 5.1 % (ref 0–12)
Platelet Count, POC: 321 10*3/uL (ref 142–424)
RBC: 4.09 M/uL (ref 4.04–5.48)
RDW, POC: 13.8 %
WBC: 11.5 10*3/uL — AB (ref 4.6–10.2)

## 2016-06-12 MED ORDER — ALBUTEROL SULFATE HFA 108 (90 BASE) MCG/ACT IN AERS: 2.0000 | INHALATION_SPRAY | RESPIRATORY_TRACT | 1 refills | Status: DC | PRN

## 2016-06-12 MED ORDER — HYDROCOD POLST-CPM POLST ER 10-8 MG/5ML PO SUER: 5.0000 mL | Freq: Two times a day (BID) | ORAL | 0 refills | Status: DC | PRN

## 2016-06-12 MED ORDER — AZITHROMYCIN 250 MG PO TABS: ORAL_TABLET | ORAL | 0 refills | Status: DC

## 2016-06-12 MED ORDER — BENZONATATE 100 MG PO CAPS: 200.0000 mg | ORAL_CAPSULE | Freq: Three times a day (TID) | ORAL | 0 refills | Status: DC | PRN

## 2016-06-12 NOTE — Patient Instructions (Addendum)
Take antibiotics as prescribed. Use inhaler every 6 hours as needed for SOB.   Follow up on Monday so we can see how you are doing, if symptoms worsen before then, seek care sooner.   Acute Bronchitis, Adult Acute bronchitis is when air tubes (bronchi) in the lungs suddenly get swollen. The condition can make it hard to breathe. It can also cause these symptoms:  A cough.  Coughing up clear, yellow, or green mucus.  Wheezing.  Chest congestion.  Shortness of breath.  A fever.  Body aches.  Chills.  A sore throat. Follow these instructions at home: Medicines  Take over-the-counter and prescription medicines only as told by your doctor.  If you were prescribed an antibiotic medicine, take it as told by your doctor. Do not stop taking the antibiotic even if you start to feel better. General instructions  Rest.  Drink enough fluids to keep your pee (urine) clear or pale yellow.  Avoid smoking and secondhand smoke. If you smoke and you need help quitting, ask your doctor. Quitting will help your lungs heal faster.  Use an inhaler, cool mist vaporizer, or humidifier as told by your doctor.  Keep all follow-up visits as told by your doctor. This is important. How is this prevented? To lower your risk of getting this condition again:  Wash your hands often with soap and water. If you cannot use soap and water, use hand sanitizer.  Avoid contact with people who have cold symptoms.  Try not to touch your hands to your mouth, nose, or eyes.  Make sure to get the flu shot every year. Contact a doctor if:  Your symptoms do not get better in 2 weeks. Get help right away if:  You cough up blood.  You have chest pain.  You have very bad shortness of breath.  You become dehydrated.  You faint (pass out) or keep feeling like you are going to pass out.  You keep throwing up (vomiting).  You have a very bad headache.  Your fever or chills gets worse. This  information is not intended to replace advice given to you by your health care provider. Make sure you discuss any questions you have with your health care provider. Document Released: 12/16/2007 Document Revised: 02/05/2016 Document Reviewed: 12/18/2015 Elsevier Interactive Patient Education  2017 [employer].    IF you received an x-ray today, you will receive an invoice from [facility]. Please contact [facility] at [phone] with questions or concerns regarding your invoice.   IF you received labwork today, you will receive an invoice from [employer]. Please contact [facility] at [phone] with questions or concerns regarding your invoice.   Our billing staff will not be able to assist you with questions regarding bills from these companies.  You will be contacted with the lab results as soon as they are available. The fastest way to get your results is to activate your My Chart account. Instructions are located on the last page of this paperwork. If you have not heard from [place] regarding the results in 2 weeks, please contact this office.

## 2016-06-12 NOTE — Progress Notes (Signed)
MRN: [id number] DOB: 11-26-1975  Subjective:   Sheena Young is a 40 y.o. female presenting for chief complaint of Shortness of Breath and Sore Throat .  Notes that for 2 weeks she has had this sore throat, nasal congestion ear fullness, SOB, and productive cough. Has associated subjective fever and chills. Has tried mucinex with no relief. Denies sinus congestion, sinus pain, chest tightness, chest pain and myalgia, nausea, vomiting and abdominal pain, and lower leg swelling. Has not had sick contact with anyone. No history of seasonal allergies or asthma. Patient has not flu shot this season. Denies smoking, she quit in 09/2015, prior to this she smoked 1.5 ppd x 10 years. Denies any other aggravating or relieving factors, no other questions or concerns.  Sheena Young has a current medication list which includes the following prescription(s): gabapentin, probiotic product, and simethicone. Also has No Known Allergies.  Sheena Young  has a past medical history of Anxiety; Chronic back pain; Gastritis; Gastritis, acute; GERD (gastroesophageal reflux disease); Headache(784.0); History of migraine; Insomnia; Joint pain; Joint swelling; Overactive bladder; Shortness of breath dyspnea; Urinary frequency; Urinary urgency; and Vomiting. Also  has a past surgical history that includes peridontal surgery; Ectopic pregnancy surgery; Lumbar laminectomy/decompression microdiscectomy (Right, 11/03/2012); Colonoscopy w/ polypectomy; and revision decompression (03/2015).   Objective:   Vitals: BP 132/82 (BP Location: Right Arm, Patient Position: Sitting, Cuff Size: Normal)   Pulse 89   Temp 98.7 F (37.1 C) (Oral)   Resp 17   Ht 5' 4.5" (1.638 m)   Wt 183 lb (83 kg)   SpO2 98%   BMI 30.93 kg/m   Physical Exam  Constitutional: She appears well-developed and well-nourished. She appears distressed (mild).  HENT:  Right Ear: Tympanic membrane, external ear and ear canal normal.  Left Ear: External ear and  ear canal normal. Tympanic membrane is perforated (stable for patient).  Nose: Mucosal edema and rhinorrhea present. Right sinus exhibits no maxillary sinus tenderness and no frontal sinus tenderness. Left sinus exhibits no maxillary sinus tenderness and no frontal sinus tenderness.  Mouth/Throat: Uvula is midline and mucous membranes are normal.  Eyes: Conjunctivae and lids are normal.  Cardiovascular: Normal rate, regular rhythm and normal heart sounds.   Pulmonary/Chest: Effort normal.  Course breath sounds bilaterally.   Lymphadenopathy:       Head (right side): Posterior auricular adenopathy present. No submental, no submandibular, no tonsillar, no preauricular and no occipital adenopathy present.       Head (left side): No submental, no submandibular, no tonsillar, no preauricular, no posterior auricular and no occipital adenopathy present.    She has no cervical adenopathy.       Right: No supraclavicular adenopathy present.       Left: No supraclavicular adenopathy present.    Results for orders placed or performed in visit on 06/12/16 (from the past 24 hour(s))  POCT CBC     Status: Abnormal   Collection Time: 06/12/16 12:57 PM  Result Value Ref Range   WBC 11.5 (A) 4.6 - 10.2 K/uL   Lymph, poc 2.9 0.6 - 3.4   POC LYMPH PERCENT 25.4 10 - 50 %L   MID (cbc) 0.6 0 - 0.9   POC MID % 5.1 0 - 12 %M   POC Granulocyte 8.0 (A) 2 - 6.9   Granulocyte percent 69.5 37 - 80 %G   RBC 4.09 4.04 - 5.48 M/uL   Hemoglobin 13.5 12.2 - 16.2 g/dL   HCT, POC 38.2 37.7 - 47.9 %  MCV 93.4 80 - 97 fL   MCH, POC 33.0 (A) 27 - 31.2 pg   MCHC 35.3 31.8 - 35.4 g/dL   RDW, POC 13.8 %   Platelet Count, POC 321 142 - 424 K/uL   MPV 7.2 0 - 99.8 fL   Dg Chest 2 View  Result Date: 06/12/2016 CLINICAL DATA:  Shortness of breath for 2 week EXAM: CHEST  2 VIEW COMPARISON:  07/12/2015 FINDINGS: The heart size and mediastinal contours are within normal limits. Both lungs are clear. The visualized skeletal  structures are unremarkable. IMPRESSION: No active cardiopulmonary disease. Electronically Signed   By: Inez Catalina M.D.   On: 06/12/2016 13:15   Assessment and Plan :  1. Cough -Will cover for atypical organisms. - DG Chest 2 View; Future - POCT CBC - azithromycin (ZITHROMAX) 250 MG tablet; Take 2 tabs PO x 1 dose, then 1 tab PO QD x 4 days  Dispense: 6 tablet; Refill: 0 - benzonatate (TESSALON) 100 MG capsule; Take 2 capsules (200 mg total) by mouth 3 (three) times daily as needed for cough.  Dispense: 20 capsule; Refill: 0 - albuterol (PROVENTIL HFA;VENTOLIN HFA) 108 (90 Base) MCG/ACT inhaler; Inhale 2 puffs into the lungs every 4 (four) hours as needed for wheezing or shortness of breath (cough, shortness of breath or wheezing.).  Dispense: 1 Inhaler; Refill: 1 - chlorpheniramine-HYDROcodone (TUSSIONEX PENNKINETIC ER) 10-8 MG/5ML SUER; Take 5 mLs by mouth every 12 (twelve) hours as needed for cough.  Dispense: 100 mL; Refill: 0 -Follow up with me in 06/15/16 for evaluation of improvement with current treatment regimen, seek care sooner if symptoms worsen  2. SOB (shortness of breath) - DG Chest 2 View; Future - POCT CBC  Tenna Delaine, PA-C  [facility] 06/12/2016 1:18 PM

## 2016-06-15 ENCOUNTER — Ambulatory Visit (INDEPENDENT_AMBULATORY_CARE_PROVIDER_SITE_OTHER): Payer: BLUE CROSS/BLUE SHIELD | Admitting: Physician Assistant

## 2016-06-15 ENCOUNTER — Encounter: Payer: Self-pay | Admitting: Physician Assistant

## 2016-06-15 VITALS — BP 102/60 | HR 83 | Temp 98.8°F | Resp 18 | Wt 184.6 lb

## 2016-06-15 DIAGNOSIS — R05 Cough: Secondary | ICD-10-CM

## 2016-06-15 DIAGNOSIS — R059 Cough, unspecified: Secondary | ICD-10-CM

## 2016-06-15 NOTE — Patient Instructions (Addendum)
  Follow up if your symptoms do not completely resolve in 2-3 weeks.   Thank you for letting me participate in your health and well being.    IF you received an x-ray today, you will receive an invoice from [facility]. Please contact [facility] at [phone] with questions or concerns regarding your invoice.   IF you received labwork today, you will receive an invoice from [employer]. Please contact [facility] at [phone] with questions or concerns regarding your invoice.   Our billing staff will not be able to assist you with questions regarding bills from these companies.  You will be contacted with the lab results as soon as they are available. The fastest way to get your results is to activate your My Chart account. Instructions are located on the last page of this paperwork. If you have not heard from [place] regarding the results in 2 weeks, please contact this office.

## 2016-06-15 NOTE — Progress Notes (Signed)
    MRN: [id number] DOB: 25-Jan-1976  Subjective:   Sheena Young is a 40 y.o. female presenting for follow up on illness. Intially seen on 06/12/16 for 2 weeks of cough, sore throat, and SOB. CXR negative, CBC showed elevated WBC of 11.5. She was started on zpack, tessalon, albuterol inhaler, and tussionex. Instructed to follow up in 3 days to see improvement. Pt notes she is feeling a lot better. She is taking medication as prescribed.   Notes the syrup is helping her sleep and the tessalon perles are helping with the cough. Denies SOB, fever, sore throat, ear pain, chills, and fever.   Ranyia has a current medication list which includes the following prescription(s): albuterol, azithromycin, benzonatate, chlorpheniramine-hydrocodone, gabapentin, probiotic product, and simethicone. Also has No Known Allergies.  Tamrah  has a past medical history of Anxiety; Chronic back pain; Gastritis; Gastritis, acute; GERD (gastroesophageal reflux disease); Headache(784.0); History of migraine; Insomnia; Joint pain; Joint swelling; Overactive bladder; Shortness of breath dyspnea; Urinary frequency; Urinary urgency; and Vomiting. Also  has a past surgical history that includes peridontal surgery; Ectopic pregnancy surgery; Lumbar laminectomy/decompression microdiscectomy (Right, 11/03/2012); Colonoscopy w/ polypectomy; and revision decompression (03/2015).   Objective:   Vitals: BP 102/60   Pulse 83   Temp 98.8 F (37.1 C) (Oral)   Resp 18   Wt 184 lb 9.6 oz (83.7 kg)   SpO2 98%   BMI 31.20 kg/m   Physical Exam  Constitutional: She is oriented to person, place, and time. She appears well-developed and well-nourished. No distress.  HENT:  Head: Normocephalic and atraumatic.  Eyes: Conjunctivae are normal.  Neck: Normal range of motion.  Cardiovascular: Normal rate, regular rhythm and normal heart sounds.   Pulmonary/Chest: Effort normal and breath sounds normal. She has no wheezes. She has no  rales.  Neurological: She is alert and oriented to person, place, and time.  Skin: Skin is warm and dry.  Psychiatric: She has a normal mood and affect.  Vitals reviewed.   No results found for this or any previous visit (from the past 24 hour(s)).  Assessment and Plan :   1. Cough Improving, instructed to continue medications as prescribed. Follow up in 2-3 weeks if symptoms have not fully resolved.    Tenna Delaine, PA-C  [facility] 06/15/2016 10:25 AM

## 2016-06-18 ENCOUNTER — Other Ambulatory Visit: Payer: Self-pay

## 2016-06-18 ENCOUNTER — Other Ambulatory Visit: Payer: Self-pay | Admitting: Physician Assistant

## 2016-06-18 DIAGNOSIS — R05 Cough: Secondary | ICD-10-CM

## 2016-06-18 DIAGNOSIS — R059 Cough, unspecified: Secondary | ICD-10-CM

## 2016-06-18 MED ORDER — HYDROCOD POLST-CPM POLST ER 10-8 MG/5ML PO SUER: 5.0000 mL | Freq: Two times a day (BID) | ORAL | 0 refills | Status: DC | PRN

## 2016-06-18 MED ORDER — BENZONATATE 100 MG PO CAPS: 200.0000 mg | ORAL_CAPSULE | Freq: Three times a day (TID) | ORAL | 0 refills | Status: DC | PRN

## 2016-06-18 NOTE — Addendum Note (Signed)
Addended by: Wardell Honour on: 06/18/2016 06:03 PM   Modules accepted: Orders

## 2016-06-18 NOTE — Telephone Encounter (Signed)
PT is calling and states Dr. Tamala Julian told her to call in when she ran out of her medication and she would refill it she needs a refill on the benzonate and chlorpheniramine hydrocodone    Please advise [phone]

## 2016-06-18 NOTE — Addendum Note (Signed)
Addended by: Virgia Land on: 06/18/2016 11:51 AM   Modules accepted: Orders

## 2016-06-18 NOTE — Telephone Encounter (Signed)
Patient seen my PA Timmothy Euler.  Will forward message to [place].

## 2016-06-19 ENCOUNTER — Other Ambulatory Visit: Payer: Self-pay | Admitting: Family Medicine

## 2016-06-19 DIAGNOSIS — R05 Cough: Secondary | ICD-10-CM

## 2016-06-19 DIAGNOSIS — R059 Cough, unspecified: Secondary | ICD-10-CM

## 2016-06-19 MED ORDER — HYDROCOD POLST-CPM POLST ER 10-8 MG/5ML PO SUER: 5.0000 mL | Freq: Two times a day (BID) | ORAL | 0 refills | Status: DC | PRN

## 2016-06-19 NOTE — Progress Notes (Signed)
Refilled prescription for [place] as she is out of the office today.

## 2016-06-19 NOTE — Telephone Encounter (Signed)
Notified pt that tessalon was sent to pharm and tussionex is ready for p/up.

## 2016-06-22 ENCOUNTER — Emergency Department (HOSPITAL_COMMUNITY): Payer: BLUE CROSS/BLUE SHIELD

## 2016-06-22 ENCOUNTER — Encounter (HOSPITAL_COMMUNITY): Payer: Self-pay | Admitting: Emergency Medicine

## 2016-06-22 ENCOUNTER — Emergency Department (HOSPITAL_COMMUNITY)
Admission: EM | Admit: 2016-06-22 | Discharge: 2016-06-22 | Disposition: A | Discharge status: Home / Self Care | Payer: BLUE CROSS/BLUE SHIELD | Attending: Emergency Medicine | Admitting: Emergency Medicine

## 2016-06-22 DIAGNOSIS — Z87891 Personal history of nicotine dependence: Secondary | ICD-10-CM | POA: Diagnosis not present

## 2016-06-22 DIAGNOSIS — N200 Calculus of kidney: Secondary | ICD-10-CM

## 2016-06-22 DIAGNOSIS — N201 Calculus of ureter: Secondary | ICD-10-CM | POA: Diagnosis not present

## 2016-06-22 DIAGNOSIS — R1032 Left lower quadrant pain: Secondary | ICD-10-CM | POA: Diagnosis not present

## 2016-06-22 DIAGNOSIS — R1012 Left upper quadrant pain: Secondary | ICD-10-CM | POA: Diagnosis not present

## 2016-06-22 DIAGNOSIS — Z79899 Other long term (current) drug therapy: Secondary | ICD-10-CM | POA: Insufficient documentation

## 2016-06-22 LAB — COMPREHENSIVE METABOLIC PANEL
ALT: 25 U/L (ref 14–54)
AST: 25 U/L (ref 15–41)
Albumin: 4.5 g/dL (ref 3.5–5.0)
Alkaline Phosphatase: 56 U/L (ref 38–126)
Anion gap: 6 (ref 5–15)
BUN: 11 mg/dL (ref 6–20)
CO2: 27 mmol/L (ref 22–32)
Calcium: 9 mg/dL (ref 8.9–10.3)
Chloride: 107 mmol/L (ref 101–111)
Creatinine, Ser: 0.73 mg/dL (ref 0.44–1.00)
GFR calc Af Amer: >60 mL/min (ref >60)
GFR calc non Af Amer: >60 mL/min (ref >60)
Glucose, Bld: 114 mg/dL — ABNORMAL HIGH (ref 65–99)
Potassium: 3.7 mmol/L (ref 3.5–5.1)
Sodium: 140 mmol/L (ref 135–145)
Total Bilirubin: 0.4 mg/dL (ref 0.3–1.2)
Total Protein: 7.5 g/dL (ref 6.5–8.1)

## 2016-06-22 LAB — URINALYSIS, ROUTINE W REFLEX MICROSCOPIC
Bilirubin Urine: NEGATIVE
Glucose, UA: NEGATIVE mg/dL
Ketones, ur: NEGATIVE mg/dL
Leukocytes, UA: NEGATIVE
Nitrite: NEGATIVE
Protein, ur: 30 mg/dL — AB
Specific Gravity, Urine: 1.016 (ref 1.005–1.030)
pH: 7 (ref 5.0–8.0)

## 2016-06-22 LAB — LIPASE, BLOOD: LIPASE: 16 U/L (ref 11–51)

## 2016-06-22 LAB — PREGNANCY, URINE: PREG TEST UR: NEGATIVE

## 2016-06-22 LAB — CBC
HCT: 40.8 % (ref 36.0–46.0)
Hemoglobin: 13.7 g/dL (ref 12.0–15.0)
MCH: 32 pg (ref 26.0–34.0)
MCHC: 33.6 g/dL (ref 30.0–36.0)
MCV: 95.3 fL (ref 78.0–100.0)
Platelets: 385 10*3/uL (ref 150–400)
RBC: 4.28 MIL/uL (ref 3.87–5.11)
RDW: 13.5 % (ref 11.5–15.5)
WBC: 12.8 10*3/uL — ABNORMAL HIGH (ref 4.0–10.5)

## 2016-06-22 MED ORDER — HYDROCODONE-ACETAMINOPHEN 5-325 MG PO TABS: 1.0000 | ORAL_TABLET | Freq: Four times a day (QID) | ORAL | 0 refills | Status: DC | PRN

## 2016-06-22 MED ORDER — SODIUM CHLORIDE 0.9 % IV BOLUS (SEPSIS)
1000.0000 mL | Freq: Once | INTRAVENOUS | Status: AC
  Administered 2016-06-22: 1000 mL via INTRAVENOUS

## 2016-06-22 MED ORDER — ONDANSETRON HCL 4 MG PO TABS: 4.0000 mg | ORAL_TABLET | Freq: Four times a day (QID) | ORAL | 0 refills | Status: DC

## 2016-06-22 MED ORDER — FAMOTIDINE IN NACL 20-0.9 MG/50ML-% IV SOLN
20.0000 mg | Freq: Once | INTRAVENOUS | Status: AC
  Administered 2016-06-22: 20 mg via INTRAVENOUS
  Filled 2016-06-22: qty 50

## 2016-06-22 MED ORDER — ONDANSETRON 4 MG PO TBDP
4.0000 mg | ORAL_TABLET | Freq: Once | ORAL | Status: AC | PRN
  Administered 2016-06-22: 4 mg via ORAL
  Filled 2016-06-22: qty 1

## 2016-06-22 MED ORDER — TAMSULOSIN HCL 0.4 MG PO CAPS: 0.4000 mg | ORAL_CAPSULE | Freq: Every day | ORAL | 0 refills | Status: DC

## 2016-06-22 MED ORDER — FAMOTIDINE 20 MG PO TABS: 20.0000 mg | ORAL_TABLET | Freq: Two times a day (BID) | ORAL | 0 refills | Status: DC

## 2016-06-22 NOTE — ED Notes (Signed)
Patient is A & O x4.  She understood discharge instructions. 

## 2016-06-22 NOTE — Discharge Instructions (Signed)
Follow-up with Dr.Wrenn in one week

## 2016-06-22 NOTE — ED Triage Notes (Signed)
Pt reports sharp LUQ pain , vomiting and diarrhea started today. Hx IBS. And chronic gastritis.

## 2016-06-22 NOTE — ED Provider Notes (Signed)
[facility] DEPT Provider Note   CSN: [id number] Arrival date & time: 06/22/16  1302     History   Chief Complaint Chief Complaint  Patient presents with  . Abdominal Pain    LUQ    HPI Sheena Young is a 40 y.o. female.  Patient complains of pain in the left lower quadrant with vomiting. After numerous times of vomiting she did throw up some blood   The history is provided by the patient. No language interpreter was used.  Abdominal Pain   This is a new problem. The current episode started more than 2 days ago. The problem occurs constantly. The problem has been resolved. The pain is associated with an unknown factor. The pain is located in the LLQ. The quality of the pain is dull. The pain is at a severity of 4/10. The pain is moderate. Pertinent negatives include anorexia, diarrhea, frequency, hematuria and headaches. Nothing aggravates the symptoms. Nothing relieves the symptoms.    Past Medical History:  Diagnosis Date  . Anxiety    but doesn't take any meds for this- ? panic attacks- not diagnosised  . Chronic back pain   . Gastritis    Chronic   . Gastritis, acute   . GERD (gastroesophageal reflux disease)    takes Zantac prn  . Headache(784.0)    occaionally  . History of migraine    last one about 83months  . Insomnia   . Joint pain   . Joint swelling   . Overactive bladder    never filled prescription bc unable to afford  . Shortness of breath dyspnea    with anxiety  . Urinary frequency   . Urinary urgency   . Vomiting     Patient Active Problem List   Diagnosis Date Noted  . Radiculopathy 03/13/2015  . 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 COLONOSCOPY W/ POLYPECTOMY    . ECTOPIC PREGNANCY SURGERY    . LUMBAR LAMINECTOMY/DECOMPRESSION MICRODISCECTOMY Right 11/03/2012   Procedure: LUMBAR LAMINECTOMY/DECOMPRESSION MICRODISCECTOMY  Right sided lumbar 4-5 microdisectomy;  Surgeon: Sinclair Ship, MD;  Location: [facility];  Service: Orthopedics;  Laterality: Right;  Right sided lumbar 4-5 microdisectomy  . peridontal surgery    . revision decompression  03/2015   decompression and fusion at L4/5, [place]    OB History    No data available       Home Medications    Prior to Admission medications   Medication Sig Start Date End Date Taking? Authorizing Provider  albuterol (PROVENTIL HFA;VENTOLIN HFA) 108 (90 Base) MCG/ACT inhaler Inhale 2 puffs into the lungs every 4 (four) hours as needed for wheezing or shortness of breath (cough, shortness of breath or wheezing.). 06/12/16  Yes Leonie Douglas, PA-C  benzonatate (TESSALON) 100 MG capsule Take 2 capsules (200 mg total) by mouth 3 (three) times daily as needed for cough. 06/18/16  Yes Leonie Douglas, PA-C  chlorpheniramine-HYDROcodone (TUSSIONEX PENNKINETIC ER) 10-8 MG/5ML SUER Take 5 mLs by mouth every 12 (twelve) hours as needed for cough. 06/19/16  Yes Sedalia Muta, FNP  gabapentin (NEURONTIN) 300 MG capsule Take 300 mg by mouth at bedtime.    Yes Historical Provider, MD  Probiotic Product (PROBIOTIC DAILY PO) Take 1 tablet by mouth daily. Reported on 07/12/2015   Yes Historical Provider, MD  Simethicone (GAS-X PO) Take 1 tablet by mouth as needed (flatulence). Reported on 07/12/2015   Yes Historical Provider,  MD  azithromycin (ZITHROMAX) 250 MG tablet Take 2 tabs PO x 1 dose, then 1 tab PO QD x 4 days Patient not taking: Reported on 06/22/2016 06/12/16   Leonie Douglas, PA-C  famotidine (PEPCID) 20 MG tablet Take 1 tablet (20 mg total) by mouth 2 (two) times daily. 06/22/16   Milton Ferguson, MD  HYDROcodone-acetaminophen (NORCO/VICODIN) 5-325 MG tablet Take 1 tablet by mouth every 6 (six) hours as needed for moderate pain. 06/22/16   Milton Ferguson, MD  ondansetron (ZOFRAN) 4 MG tablet Take 1 tablet (4 mg total) by mouth every 6 (six) hours. 06/22/16   Milton Ferguson, MD  tamsulosin (FLOMAX) 0.4 MG  CAPS capsule Take 1 capsule (0.4 mg total) by mouth daily. 06/22/16   Milton Ferguson, MD    Family History Family History  Problem Relation Age of Onset  . Cervical cancer Sister   . Diabetes Maternal Grandmother   . Congestive Heart Failure Maternal Grandmother   . Diabetes Maternal Aunt     x 3  . Diabetes Maternal Uncle     x 3  . Heart attack Paternal Grandfather   . Heart disease Maternal Uncle   . Colon polyps Neg Hx   . Colon cancer Neg Hx   . Liver disease Neg Hx   . Kidney disease Neg Hx     Social History Social History  Substance Use Topics  . Smoking status: Former Smoker    Packs/day: 0.25    Years: 8.00    Types: E-cigarettes    Quit date: 03/11/2015  . Smokeless tobacco: Never Used     Comment: of electronic cigarette  . Alcohol use 0.0 - 1.2 oz/week     Comment: occasionally     Allergies   Patient has no known allergies.   Review of Systems Review of Systems  Constitutional: Negative for appetite change and fatigue.  HENT: Negative for congestion, ear discharge and sinus pressure.   Eyes: Negative for discharge.  Respiratory: Negative for cough.   Cardiovascular: Negative for chest pain.  Gastrointestinal: Positive for abdominal pain. Negative for anorexia and diarrhea.  Genitourinary: Negative for frequency and hematuria.  Musculoskeletal: Negative for back pain.  Skin: Negative for rash.  Neurological: Negative for seizures and headaches.  Psychiatric/Behavioral: Negative for hallucinations.     Physical Exam Updated Vital Signs BP 115/55   Pulse (!) 53   Temp 97.1 F (36.2 C) (Oral)   Resp 16   LMP 06/15/2016   SpO2 94%   Physical Exam  Constitutional: She is oriented to person, place, and time. She appears well-developed.  HENT:  Head: Normocephalic.  Eyes: Conjunctivae and EOM are normal. No scleral icterus.  Neck: Neck supple. No thyromegaly present.  Cardiovascular: Normal rate and regular rhythm.  Exam reveals no gallop  and no friction rub.   No murmur heard. Pulmonary/Chest: No stridor. She has no wheezes. She has no rales. She exhibits no tenderness.  Abdominal: She exhibits no distension. There is no tenderness. There is no rebound.  Musculoskeletal: Normal range of motion. She exhibits no edema.  Lymphadenopathy:    She has no cervical adenopathy.  Neurological: She is oriented to person, place, and time. She exhibits normal muscle tone. Coordination normal.  Skin: No rash noted. No erythema.  Psychiatric: She has a normal mood and affect. Her behavior is normal.     ED Treatments / Results  Labs (all labs ordered are listed, but only abnormal results are displayed) Labs Reviewed  COMPREHENSIVE  METABOLIC PANEL - Abnormal; Notable for the following:       Result Value   Glucose, Bld 114 (*)    All other components within normal limits  CBC - Abnormal; Notable for the following:    WBC 12.8 (*)    All other components within normal limits  URINALYSIS, ROUTINE W REFLEX MICROSCOPIC - Abnormal; Notable for the following:    APPearance HAZY (*)    Hgb urine dipstick LARGE (*)    Protein, ur 30 (*)    Bacteria, UA FEW (*)    Squamous Epithelial / LPF 0-5 (*)    All other components within normal limits  LIPASE, BLOOD  PREGNANCY, URINE    EKG  EKG Interpretation None       Radiology Dg Abd Acute W/chest  Result Date: 06/22/2016 CLINICAL DATA:  Sharp left upper quadrant pain. Vomiting and diarrhea beginning today. EXAM: DG ABDOMEN ACUTE W/ 1V CHEST COMPARISON:  06/12/2016 FINDINGS: A 4 mm stone projects over the left psoas shadow just lateral to the lower endplate of L2. Normal bowel gas pattern. No free air. No other soft tissue abnormality. The heart, mediastinum and hila are unremarkable. The lungs are clear. 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 There has been a previous L5-S1 posterior lumbar spine fusion. IMPRESSION: 1. 4 mm stone on the left suggests a proximal  left ureteral stone and may be the source of this patient's pain. This was not present on abdominal radiographs dated 07/12/2013. 2. Normal bowel gas pattern. Specifically, no bowel obstruction or adynamic ileus. 3. Normal frontal chest radiograph. Electronically Signed   By: Lajean Manes M.D.   On: 06/22/2016 17:41    Procedures Procedures (including critical care time)  Medications Ordered in ED Medications  ondansetron (ZOFRAN-ODT) disintegrating tablet 4 mg (4 mg Oral Given 06/22/16 1316)  sodium chloride 0.9 % bolus 1,000 mL (1,000 mLs Intravenous New Bag/Given 06/22/16 1652)  famotidine (PEPCID) IVPB 20 mg premix (0 mg Intravenous Stopped 06/22/16 1726)     Initial Impression / Assessment and Plan / ED Course  I have reviewed the triage vital signs and the nursing notes.  Pertinent labs & imaging results that were available during my care of the patient were reviewed by me and considered in my medical decision making (see chart for details).  Clinical Course     Plain films show 4 mm kidney stone left proximal ureter. Vomiting from the kidney stone less likely along with hematuria. Patient developed some blood suspect is from [employer] tear. She will be put on Pepcid because she is allergic to [facility].  Patient also put on Vicodin and Zofran and Flomax will follow-up with urology  Final Clinical Impressions(s) / ED Diagnoses   Final diagnoses:  Kidney stone    New Prescriptions New Prescriptions   FAMOTIDINE (PEPCID) 20 MG TABLET    Take 1 tablet (20 mg total) by mouth 2 (two) times daily.   HYDROCODONE-ACETAMINOPHEN (NORCO/VICODIN) 5-325 MG TABLET    Take 1 tablet by mouth every 6 (six) hours as needed for moderate pain.   ONDANSETRON (ZOFRAN) 4 MG TABLET    Take 1 tablet (4 mg total) by mouth every 6 (six) hours.   TAMSULOSIN (FLOMAX) 0.4 MG CAPS CAPSULE    Take 1 capsule (0.4 mg total) by mouth daily.     Milton Ferguson, MD 06/22/16 [phone]

## 2016-06-29 ENCOUNTER — Other Ambulatory Visit: Payer: Self-pay | Admitting: Urology

## 2016-06-29 ENCOUNTER — Encounter (HOSPITAL_COMMUNITY): Payer: Self-pay | Admitting: *Deleted

## 2016-06-29 DIAGNOSIS — N201 Calculus of ureter: Secondary | ICD-10-CM | POA: Diagnosis not present

## 2016-07-01 NOTE — H&P (Signed)
CC: I have a ureteral stone.  HPI: Sheena Young is a 40 year-old female patient who was referred by Verneda Skill who is here for ureteral stone.  The problem is on the left side. She first noticed the symptoms approximately 06/22/2016. This is her first kidney stone. She is currently having flank pain and nausea. She denies having back pain, groin pain, vomiting, fever, and chills. She does not have a burning sensation when she urinates. She has not caught a stone in her urine strainer since her symptoms began.   FH positive stones in mom and sister       CC/HPI: Sheena Young has had one week of intermittent left flank pain consistent with renal colic. Most of her discomfort is actually better left lower quadrant. She underwent KUB imaging in the emergency room. There is a faint but clearly visible.. Radial 4 mm calcification in the area of the proximal left ureter.     ALLERGIES: None   MEDICATIONS: Albuterol Sulfate  Famotidine  Gabapentin  Gax-X  Hydrocodone-Acetaminophen     GU PSH: None   NON-GU PSH: Colonoscopy Lumbar Laminectomy Upper GI Endoscopy    GU PMH: None   NON-GU PMH: Arrhythmia Other irritable bowel syndrome Personal history of other diseases of the musculoskeletal system and connective tissue    FAMILY HISTORY: Acute Renal Failure - Uncle Breast Cancer - Grandmother, Aunt Diabetes - Aunt, Grandmother Heart Disease - Aunt, Grandmother Hematuria - Mother, Sister irritable bowel syndrome - Mother, Aunt nephrolithiasis - Sister, Mother Prostate Cancer - Grandfather   SOCIAL HISTORY: Marital Status: Married Current Smoking Status: Patient does not smoke anymore. Has not smoked since 06/13/2015. Smoked for 10 years. Smoked 2 packs per day.   Tobacco Use Assessment Completed: Used Tobacco in last 30 days? Social Drinker.  Drinks 1 caffeinated drink per day. Patient's occupation Chartered certified accountant.    REVIEW OF SYSTEMS:    GU Review Female:    hematuria. Patient reports get up at night to urinate and stream starts and stops. Patient denies frequent urination, hard to postpone urination, burning /pain with urination, leakage of urine, trouble starting your stream, have to strain to urinate, and currently pregnant.  Gastrointestinal (Upper):   Patient reports nausea, vomiting, and indigestion/ heartburn.   Gastrointestinal (Lower):   Patient reports diarrhea and constipation.   Constitutional:   Patient reports night sweats, fever, and fatigue. Patient denies weight loss.  Skin:   Patient denies skin rash/ lesion and itching.  Eyes:   Patient denies blurred vision and double vision.  Ears/ Nose/ Throat:   Patient reports sinus problems. Patient denies sore throat.  Hematologic/Lymphatic:   Patient denies swollen glands and easy bruising.  Cardiovascular:   Patient reports chest pains. Patient denies leg swelling.  Respiratory:   Patient reports cough and shortness of breath.   Endocrine:   Patient reports excessive thirst.   Musculoskeletal:   Patient reports back pain and joint pain.   Neurological:   Patient reports headaches and dizziness.   Psychologic:   Patient denies depression and anxiety.   VITAL SIGNS:      06/29/2016 10:30 AM  Weight 189 lb / 85.73 kg  Height 65 in / 165.1 cm  BP 117/78 mmHg  Pulse 84 /min  BMI 31.4 kg/m   MULTI-SYSTEM PHYSICAL EXAMINATION:    Constitutional: Well-nourished. No physical deformities. Normally developed. Good grooming.  Neck: Neck symmetrical, not swollen. Normal tracheal position.  Respiratory: No labored breathing, no use of accessory muscles.  Cardiovascular: Normal temperature, normal extremity pulses, no swelling, no varicosities.  Lymphatic: No enlargement of neck, axillae, groin.  Skin: No paleness, no jaundice, no cyanosis. No lesion, no ulcer, no rash.  Neurologic / Psychiatric: Oriented to time, oriented to place, oriented to person. No depression, no anxiety, no agitation.   Gastrointestinal: No mass, no tenderness, no rigidity, non obese abdomen.  Eyes: Normal conjunctivae. Normal eyelids.  Ears, Nose, Mouth, and Throat: Left ear no scars, no lesions, no masses. Right ear no scars, no lesions, no masses. Nose no scars, no lesions, no masses. Normal hearing. Normal lips.  Musculoskeletal: Normal gait and station of head and neck.     PAST DATA REVIEWED:  Source Of History:  Patient   PROCEDURES:         KUB - 74000  A single view of the abdomen is obtained.  Calculi:  Proximal left ureter calculi.               Urinalysis - 81003 Dipstick Dipstick Cont'd  Color: Yellow Bilirubin: Neg  Appearance: Clear Ketones: Neg  Specific Gravity: 1.020 Blood: Neg  pH: 6.0 Protein: Neg  Glucose: Neg Urobilinogen: 1.0    Nitrites: Neg    Leukocyte Esterase: Neg    Notes:      ASSESSMENT:      ICD-10 Details  1 GU:   Calculus Ureter - N20.1    PLAN:            Medications New Meds: Vicodin 5 mg-300 mg tablet 1-2 tablet PO Q 6 H   #30  0 Refill(s)            Orders X-Rays: KUB          Schedule Return Visit/Planned Activity: 1 Week - Schedule Surgery          Document Letter(s):  Created for Patient: Clinical Summary         Notes:   4 mm left proximal ureteral stone. This was initially identified on KUB imaging and is in a consistent location today without any significant progression. Given the hematuria as well as her classic symptoms, I don't think CT imaging is necessary and the diagnosis certainly appears her relatively conclusive based on her KUB imaging. She is shown no progression of the stone in one week's time. She is interested in definitive treatment. Her only stone is in a good position for ESWL. We will attempt to get her on the schedule for sometime in the next couple of days. We did discuss the procedure, what to expect typical recovery and success rates.

## 2016-07-02 ENCOUNTER — Ambulatory Visit (HOSPITAL_COMMUNITY): Payer: BLUE CROSS/BLUE SHIELD

## 2016-07-02 ENCOUNTER — Encounter (HOSPITAL_COMMUNITY)
Admission: RE | Disposition: A | Discharge status: Home / Self Care | Payer: Self-pay | Source: Ambulatory Visit | Attending: Urology

## 2016-07-02 ENCOUNTER — Ambulatory Visit (HOSPITAL_COMMUNITY)
Admission: RE | Admit: 2016-07-02 | Discharge: 2016-07-02 | Disposition: A | Discharge status: Home / Self Care | Payer: BLUE CROSS/BLUE SHIELD | Source: Ambulatory Visit | Attending: Urology | Admitting: Urology

## 2016-07-02 ENCOUNTER — Encounter (HOSPITAL_COMMUNITY): Payer: Self-pay | Admitting: *Deleted

## 2016-07-02 DIAGNOSIS — Z87891 Personal history of nicotine dependence: Secondary | ICD-10-CM | POA: Insufficient documentation

## 2016-07-02 DIAGNOSIS — N201 Calculus of ureter: Secondary | ICD-10-CM | POA: Diagnosis not present

## 2016-07-02 DIAGNOSIS — Z79899 Other long term (current) drug therapy: Secondary | ICD-10-CM | POA: Diagnosis not present

## 2016-07-02 DIAGNOSIS — Z01818 Encounter for other preprocedural examination: Secondary | ICD-10-CM | POA: Diagnosis not present

## 2016-07-02 HISTORY — DX: Personal history of urinary calculi: Z87.442

## 2016-07-02 LAB — PREGNANCY, URINE: PREG TEST UR: NEGATIVE

## 2016-07-02 SURGERY — LITHOTRIPSY, ESWL
Anesthesia: LOCAL | Laterality: Left

## 2016-07-02 MED ORDER — FENTANYL CITRATE (PF) 100 MCG/2ML IJ SOLN: 25.0000 ug | INTRAMUSCULAR | Status: DC | PRN

## 2016-07-02 MED ORDER — ONDANSETRON HCL 4 MG PO TABS: 4.0000 mg | ORAL_TABLET | Freq: Four times a day (QID) | ORAL | 1 refills | Status: DC | PRN

## 2016-07-02 MED ORDER — ACETAMINOPHEN 325 MG PO TABS: 650.0000 mg | ORAL_TABLET | ORAL | Status: DC | PRN

## 2016-07-02 MED ORDER — SODIUM CHLORIDE 0.9% FLUSH: 3.0000 mL | INTRAVENOUS | Status: DC | PRN

## 2016-07-02 MED ORDER — DIPHENHYDRAMINE HCL 25 MG PO CAPS
25.0000 mg | ORAL_CAPSULE | ORAL | Status: AC
  Administered 2016-07-02: 25 mg via ORAL
  Filled 2016-07-02: qty 1

## 2016-07-02 MED ORDER — SODIUM CHLORIDE 0.9 % IV SOLN
INTRAVENOUS | Status: DC
  Administered 2016-07-02: 09:00:00 via INTRAVENOUS

## 2016-07-02 MED ORDER — ACETAMINOPHEN 650 MG RE SUPP
650.0000 mg | RECTAL | Status: DC | PRN
  Filled 2016-07-02: qty 1

## 2016-07-02 MED ORDER — DIAZEPAM 5 MG PO TABS
10.0000 mg | ORAL_TABLET | ORAL | Status: AC
  Administered 2016-07-02: 10 mg via ORAL
  Filled 2016-07-02: qty 2

## 2016-07-02 MED ORDER — SODIUM CHLORIDE 0.9% FLUSH: 3.0000 mL | Freq: Two times a day (BID) | INTRAVENOUS | Status: DC

## 2016-07-02 MED ORDER — OXYCODONE HCL 5 MG PO TABS: 5.0000 mg | ORAL_TABLET | ORAL | Status: DC | PRN

## 2016-07-02 MED ORDER — CEFAZOLIN SODIUM-DEXTROSE 2-4 GM/100ML-% IV SOLN
2.0000 g | INTRAVENOUS | Status: AC
  Administered 2016-07-02: 2 g via INTRAVENOUS
  Filled 2016-07-02 (×2): qty 100

## 2016-07-02 MED ORDER — SODIUM CHLORIDE 0.9 % IV SOLN: 250.0000 mL | INTRAVENOUS | Status: DC | PRN

## 2016-07-02 MED ORDER — TAMSULOSIN HCL 0.4 MG PO CAPS: 0.4000 mg | ORAL_CAPSULE | Freq: Every day | ORAL | 1 refills | Status: DC

## 2016-07-02 NOTE — Interval H&P Note (Signed)
History and Physical Interval Note:  No change in stone   07/02/2016 10:13 AM  Sheena Young  has presented today for surgery, with the diagnosis of LEFT URETERAL STONE  The various methods of treatment have been discussed with the patient and family. After consideration of risks, benefits and other options for treatment, the patient has consented to  Procedure(s): LEFT EXTRACORPOREAL SHOCK WAVE LITHOTRIPSY (ESWL) (Left) as a surgical intervention .  The patient's history has been reviewed, patient examined, no change in status, stable for surgery.  I have reviewed the patient's chart and labs.  Questions were answered to the patient's satisfaction.     [name]

## 2016-07-02 NOTE — Discharge Instructions (Signed)
Lithotripsy, Care After °Refer to this sheet in the next few weeks. These instructions provide you with information on caring for yourself after your procedure. Your health care provider may also give you more specific instructions. Your treatment has been planned according to current medical practices, but problems sometimes occur. Call your health care provider if you have any problems or questions after your procedure. °WHAT TO EXPECT AFTER THE PROCEDURE  °· Your urine may have a red tinge for a few days after treatment. Blood loss is usually minimal. °· You may have soreness in the back or flank area. This usually goes away after a few days. The procedure can cause blotches or bruises on the back where the pressure wave enters the skin. These marks usually cause only minimal discomfort and should disappear in a short time. °· Stone fragments should begin to pass within 24 hours of treatment. However, a delayed passage is not unusual. °· You may have pain, discomfort, and feel sick to your stomach (nauseated) when the crushed fragments of stone are passed down the tube from the kidney to the bladder. Stone fragments can pass soon after the procedure and may last for up to 4-8 weeks. °· A small number of patients may have severe pain when stone fragments are not able to pass, which leads to an obstruction. °· If your stone is greater than 1 inch (2.5 cm) in diameter or if you have multiple stones that have a combined diameter greater than 1 inch (2.5 cm), you may require more than one treatment. °· If you had a stent placed prior to your procedure, you may experience some discomfort, especially during urination. You may experience the pain or discomfort in your flank or back, or you may experience a sharp pain or discomfort at the base of your penis or in your lower abdomen. The discomfort usually lasts only a few minutes after urinating. °HOME CARE INSTRUCTIONS  °· Rest at home until you feel your energy  improving. °· Only take over-the-counter or prescription medicines for pain, discomfort, or fever as directed by your health care provider. Depending on the type of lithotripsy, you may need to take antibiotics and anti-inflammatory medicines for a few days. °· Drink enough water and fluids to keep your urine clear or pale yellow. This helps "flush" your kidneys. It helps pass any remaining pieces of stone and prevents stones from coming back. °· Most people can resume daily activities within 1-2 days after standard lithotripsy. It can take longer to recover from laser and percutaneous lithotripsy. °· Strain all urine through the provided strainer. Keep all particulate matter and stones for your health care provider to see. The stone may be as small as a grain of salt. It is very important to use the strainer each and every time you pass your urine. Any stones that are found can be sent to a medical lab for examination. °· Visit your health care provider for a follow-up appointment in a few weeks. Your doctor may remove your stent if you have one. Your health care provider will also check to see whether stone particles still remain. °SEEK MEDICAL CARE IF:  °· Your pain is not relieved by medicine. °· You have a lasting nauseous feeling. °· You feel there is too much blood in the urine. °· You develop persistent problems with frequent or painful urination that does not at least partially improve after 2 days following the procedure. °· You have a congested cough. °· You feel   lightheaded.  You develop a rash or any other signs that might suggest an allergic problem.  You develop any reaction or side effects to your medicine(s). SEEK IMMEDIATE MEDICAL CARE IF:   You experience severe back or flank pain or both.  You see nothing but blood when you urinate.  You cannot pass any urine at all.  You have a fever or shaking chills.  You develop shortness of breath, difficulty breathing, or chest pain.  You  develop vomiting that will not stop after 6-8 hours.  You have a fainting episode. This information is not intended to replace advice given to you by your health care provider. Make sure you discuss any questions you have with your health care provider. Document Released: 07/19/2007 Document Revised: 03/20/2015 Document Reviewed: 01/12/2013 Elsevier Interactive Patient Education  2017 [employer].

## 2016-07-23 DIAGNOSIS — N201 Calculus of ureter: Secondary | ICD-10-CM | POA: Diagnosis not present

## 2016-09-18 ENCOUNTER — Ambulatory Visit (INDEPENDENT_AMBULATORY_CARE_PROVIDER_SITE_OTHER): Payer: BLUE CROSS/BLUE SHIELD | Admitting: Urgent Care

## 2016-09-18 VITALS — BP 122/72 | HR 107 | Temp 99.1°F | Resp 17 | Ht 64.0 in | Wt 179.0 lb

## 2016-09-18 DIAGNOSIS — R0602 Shortness of breath: Secondary | ICD-10-CM

## 2016-09-18 DIAGNOSIS — R110 Nausea: Secondary | ICD-10-CM

## 2016-09-18 DIAGNOSIS — J101 Influenza due to other identified influenza virus with other respiratory manifestations: Secondary | ICD-10-CM

## 2016-09-18 DIAGNOSIS — R05 Cough: Secondary | ICD-10-CM | POA: Diagnosis not present

## 2016-09-18 DIAGNOSIS — R52 Pain, unspecified: Secondary | ICD-10-CM | POA: Diagnosis not present

## 2016-09-18 DIAGNOSIS — R059 Cough, unspecified: Secondary | ICD-10-CM

## 2016-09-18 DIAGNOSIS — R0789 Other chest pain: Secondary | ICD-10-CM | POA: Diagnosis not present

## 2016-09-18 LAB — POCT INFLUENZA A/B
INFLUENZA A, POC: NEGATIVE
Influenza B, POC: POSITIVE — AB

## 2016-09-18 MED ORDER — CETIRIZINE HCL 10 MG PO TABS: 10.0000 mg | ORAL_TABLET | Freq: Every day | ORAL | 11 refills | Status: DC

## 2016-09-18 MED ORDER — BENZONATATE 100 MG PO CAPS: 100.0000 mg | ORAL_CAPSULE | Freq: Three times a day (TID) | ORAL | 0 refills | Status: DC | PRN

## 2016-09-18 MED ORDER — OSELTAMIVIR PHOSPHATE 75 MG PO CAPS: 75.0000 mg | ORAL_CAPSULE | Freq: Two times a day (BID) | ORAL | 0 refills | Status: DC

## 2016-09-18 MED ORDER — ONDANSETRON 8 MG PO TBDP: 8.0000 mg | ORAL_TABLET | Freq: Three times a day (TID) | ORAL | 0 refills | Status: DC | PRN

## 2016-09-18 MED ORDER — ALBUTEROL SULFATE HFA 108 (90 BASE) MCG/ACT IN AERS: 2.0000 | INHALATION_SPRAY | RESPIRATORY_TRACT | 1 refills | Status: DC | PRN

## 2016-09-18 MED ORDER — HYDROCODONE-HOMATROPINE 5-1.5 MG/5ML PO SYRP: 5.0000 mL | ORAL_SOLUTION | Freq: Every evening | ORAL | 0 refills | Status: DC | PRN

## 2016-09-18 NOTE — Patient Instructions (Addendum)
Influenza, Adult Influenza, more commonly known as "the flu," is a viral infection that primarily affects the respiratory tract. The respiratory tract includes organs that help you breathe, such as the lungs, nose, and throat. The flu causes many common cold symptoms, as well as a high fever and body aches. The flu spreads easily from person to person (is contagious). Getting a flu shot (influenza vaccination) every year is the best way to prevent influenza. What are the causes? Influenza is caused by a virus. You can catch the virus by:  Breathing in droplets from an infected person's cough or sneeze.  Touching something that was recently contaminated with the virus and then touching your mouth, nose, or eyes.  What increases the risk? The following factors may make you more likely to get the flu:  Not cleaning your hands frequently with soap and water or alcohol-based hand sanitizer.  Having close contact with many people during cold and flu season.  Touching your mouth, eyes, or nose without washing or sanitizing your hands first.  Not drinking enough fluids or not eating a healthy diet.  Not getting enough sleep or exercise.  Being under a high amount of stress.  Not getting a yearly (annual) flu shot.  You may be at a higher risk of complications from the flu, such as a severe lung infection (pneumonia), if you:  Are over the age of 65.  Are pregnant.  Have a weakened disease-fighting system (immune system). You may have a weakened immune system if you: ? Have HIV or AIDS. ? Are undergoing chemotherapy. ? Aretaking medicines that reduce the activity of (suppress) the immune system.  Have a long-term (chronic) illness, such as heart disease, kidney disease, diabetes, or lung disease.  Have a liver disorder.  Are obese.  Have anemia.  What are the signs or symptoms? Symptoms of this condition typically last 4-10 days and may  include:  Fever.  Chills.  Headache, body aches, or muscle aches.  Sore throat.  Cough.  Runny or congested nose.  Chest discomfort and cough.  Poor appetite.  Weakness or tiredness (fatigue).  Dizziness.  Nausea or vomiting.  How is this diagnosed? This condition may be diagnosed based on your medical history and a physical exam. Your health care provider may do a nose or throat swab test to confirm the diagnosis. How is this treated? If influenza is detected early, you can be treated with antiviral medicine that can reduce the length of your illness and the severity of your symptoms. This medicine may be given by mouth (orally) or through an IV tube that is inserted in one of your veins. The goal of treatment is to relieve symptoms by taking care of yourself at home. This may include taking over-the-counter medicines, drinking plenty of fluids, and adding humidity to the air in your home. In some cases, influenza goes away on its own. Severe influenza or complications from influenza may be treated in a hospital. Follow these instructions at home:  Take over-the-counter and prescription medicines only as told by your health care provider.  Use a cool mist humidifier to add humidity to the air in your home. This can make breathing easier.  Rest as needed.  Drink enough fluid to keep your urine clear or pale yellow.  Cover your mouth and nose when you cough or sneeze.  Wash your hands with soap and water often, especially after you cough or sneeze. If soap and water are not available, use hand sanitizer.    Stay home from work or school as told by your health care provider. Unless you are visiting your health care provider, try to avoid leaving home until your fever has been gone for 24 hours without the use of medicine.  Keep all follow-up visits as told by your health care provider. This is important. How is this prevented?  Getting an annual flu shot is the best way  to avoid getting the flu. You may get the flu shot in late summer, fall, or winter. Ask your health care provider when you should get your flu shot.  Wash your hands often or use hand sanitizer often.  Avoid contact with people who are sick during cold and flu season.  Eat a healthy diet, drink plenty of fluids, get enough sleep, and exercise regularly. Contact a health care provider if:  You develop new symptoms.  You have: ? Chest pain. ? Diarrhea. ? A fever.  Your cough gets worse.  You produce more mucus.  You feel nauseous or you vomit. Get help right away if:  You develop shortness of breath or difficulty breathing.  Your skin or nails turn a bluish color.  You have severe pain or stiffness in your neck.  You develop a sudden headache or sudden pain in your face or ear.  You cannot stop vomiting. This information is not intended to replace advice given to you by your health care provider. Make sure you discuss any questions you have with your health care provider. Document Released: 06/26/2000 Document Revised: 12/05/2015 Document Reviewed: 04/23/2015 Elsevier Interactive Patient Education  2017 [employer]    IF you received an x-ray today, you will receive an invoice from [facility]. Please contact [facility] at [phone] with questions or concerns regarding your invoice.   IF you received labwork today, you will receive an invoice from [facility]. Please contact [facility] at [phone] with questions or concerns regarding your invoice.   Our billing staff will not be able to assist you with questions regarding bills from these companies.  You will be contacted with the lab results as soon as they are available. The fastest way to get your results is to activate your My Chart account. Instructions are located on the last page of this paperwork. If you have not heard from us regarding the results in 2 weeks, please contact this office.       

## 2016-09-18 NOTE — Progress Notes (Signed)
  MRN: [id number] DOB: April 29, 1976  Subjective:   Sheena Young is a 41 y.o. female presenting for chief complaint of Cough; URI; and Sore Throat  Reports 1 day history of cough that elicits chest pain, shob, body aches, headache, subjective fever, sore throat, malaise. Patient was in a hospital for one of her relatives recently, had plenty of sick contacts. She has been using Alleve for relief of her symptoms. Uses Vape cigarettes. Denies using allergy medications for her allergies.   Sheena Young has a current medication list which includes the following prescription(s): albuterol, famotidine, gabapentin, probiotic product, and simethicone. Also has No Known Allergies.  Sheena Young  has a past medical history of Anxiety; Chronic back pain; Gastritis; Gastritis, acute; GERD (gastroesophageal reflux disease); Headache(784.0); History of kidney stones; History of migraine; Insomnia; Joint pain; Joint swelling; Overactive bladder; Shortness of breath dyspnea; Urinary frequency; Urinary urgency; and Vomiting. Also  has a past surgical history that includes peridontal surgery; Ectopic pregnancy surgery; Lumbar laminectomy/decompression microdiscectomy (Right, 11/03/2012); Colonoscopy w/ polypectomy; and revision decompression (03/2015).  Objective:   Vitals: BP 122/72   Pulse (!) 107   Temp 99.1 F (37.3 C) (Oral)   Resp 17   Ht 5\' 4"  (1.626 m)   Wt 179 lb (81.2 kg)   LMP 09/18/2016 (Approximate)   SpO2 97%   BMI 30.73 kg/m   Physical Exam  Constitutional: She is oriented to person, place, and time. She appears well-developed and well-nourished.  HENT:  TM's intact bilaterally, no effusions or erythema. Nasal turbinates pink and moist, nasal passages patent.  Oropharynx with moderate post-nasal drainage, mucous membranes moist, dentition in good repair.  Eyes: Right eye exhibits no discharge. Left eye exhibits no discharge.  Neck: Normal range of motion. Neck supple.  Cardiovascular: Normal  rate, regular rhythm and intact distal pulses.  Exam reveals no gallop and no friction rub.   No murmur heard. Pulmonary/Chest: No respiratory distress. She has no wheezes. She has no rales.  Lymphadenopathy:    She has no cervical adenopathy.  Neurological: She is alert and oriented to person, place, and time.  Skin: Skin is warm and dry.   Results for orders placed or performed in visit on 09/18/16 (from the past 24 hour(s))  POCT Influenza A/B     Status: Abnormal   Collection Time: 09/18/16 11:27 AM  Result Value Ref Range   Influenza A, POC Negative Negative   Influenza B, POC Positive (A) Negative   Assessment and Plan :   1. Body aches 2. Cough 3. Influenza B 4. Nausea without vomiting 5. Shortness of breath 6. Atypical chest pain - Will manage supportively for influenza, start Tamiflu. Patient to rtc if no improvement in symptoms in 1 week.  Jaynee Eagles, PA-C [facility] [phone] 09/18/2016  10:47 AM

## 2016-12-29 DIAGNOSIS — M25561 Pain in right knee: Secondary | ICD-10-CM | POA: Diagnosis not present

## 2016-12-29 DIAGNOSIS — M94261 Chondromalacia, right knee: Secondary | ICD-10-CM | POA: Diagnosis not present

## 2016-12-29 DIAGNOSIS — M25571 Pain in right ankle and joints of right foot: Secondary | ICD-10-CM | POA: Diagnosis not present

## 2017-01-01 DIAGNOSIS — M5416 Radiculopathy, lumbar region: Secondary | ICD-10-CM | POA: Diagnosis not present

## 2017-01-12 ENCOUNTER — Other Ambulatory Visit: Payer: Self-pay | Admitting: Orthopedic Surgery

## 2017-01-12 DIAGNOSIS — M5416 Radiculopathy, lumbar region: Secondary | ICD-10-CM

## 2017-01-27 ENCOUNTER — Other Ambulatory Visit: Payer: BLUE CROSS/BLUE SHIELD

## 2017-06-15 ENCOUNTER — Ambulatory Visit: Payer: BLUE CROSS/BLUE SHIELD | Admitting: Urgent Care

## 2017-09-14 ENCOUNTER — Other Ambulatory Visit: Payer: Self-pay | Admitting: Urgent Care

## 2017-09-14 NOTE — Telephone Encounter (Signed)
Cetirizine refill request LR: 09/18/16 #30 tablets 1 RF LOV: 09/18/16 PCP: Reginia Forts MD Pharmacy: [facility]

## 2018-03-28 IMAGING — DX DG CHEST 2V
2 series · 2 of 2 positions shown · non-contrast
Comparison: 07/12/2015

CLINICAL DATA: Shortness of breath for 2 week

EXAM:
CHEST  2 VIEW

[chest pa]
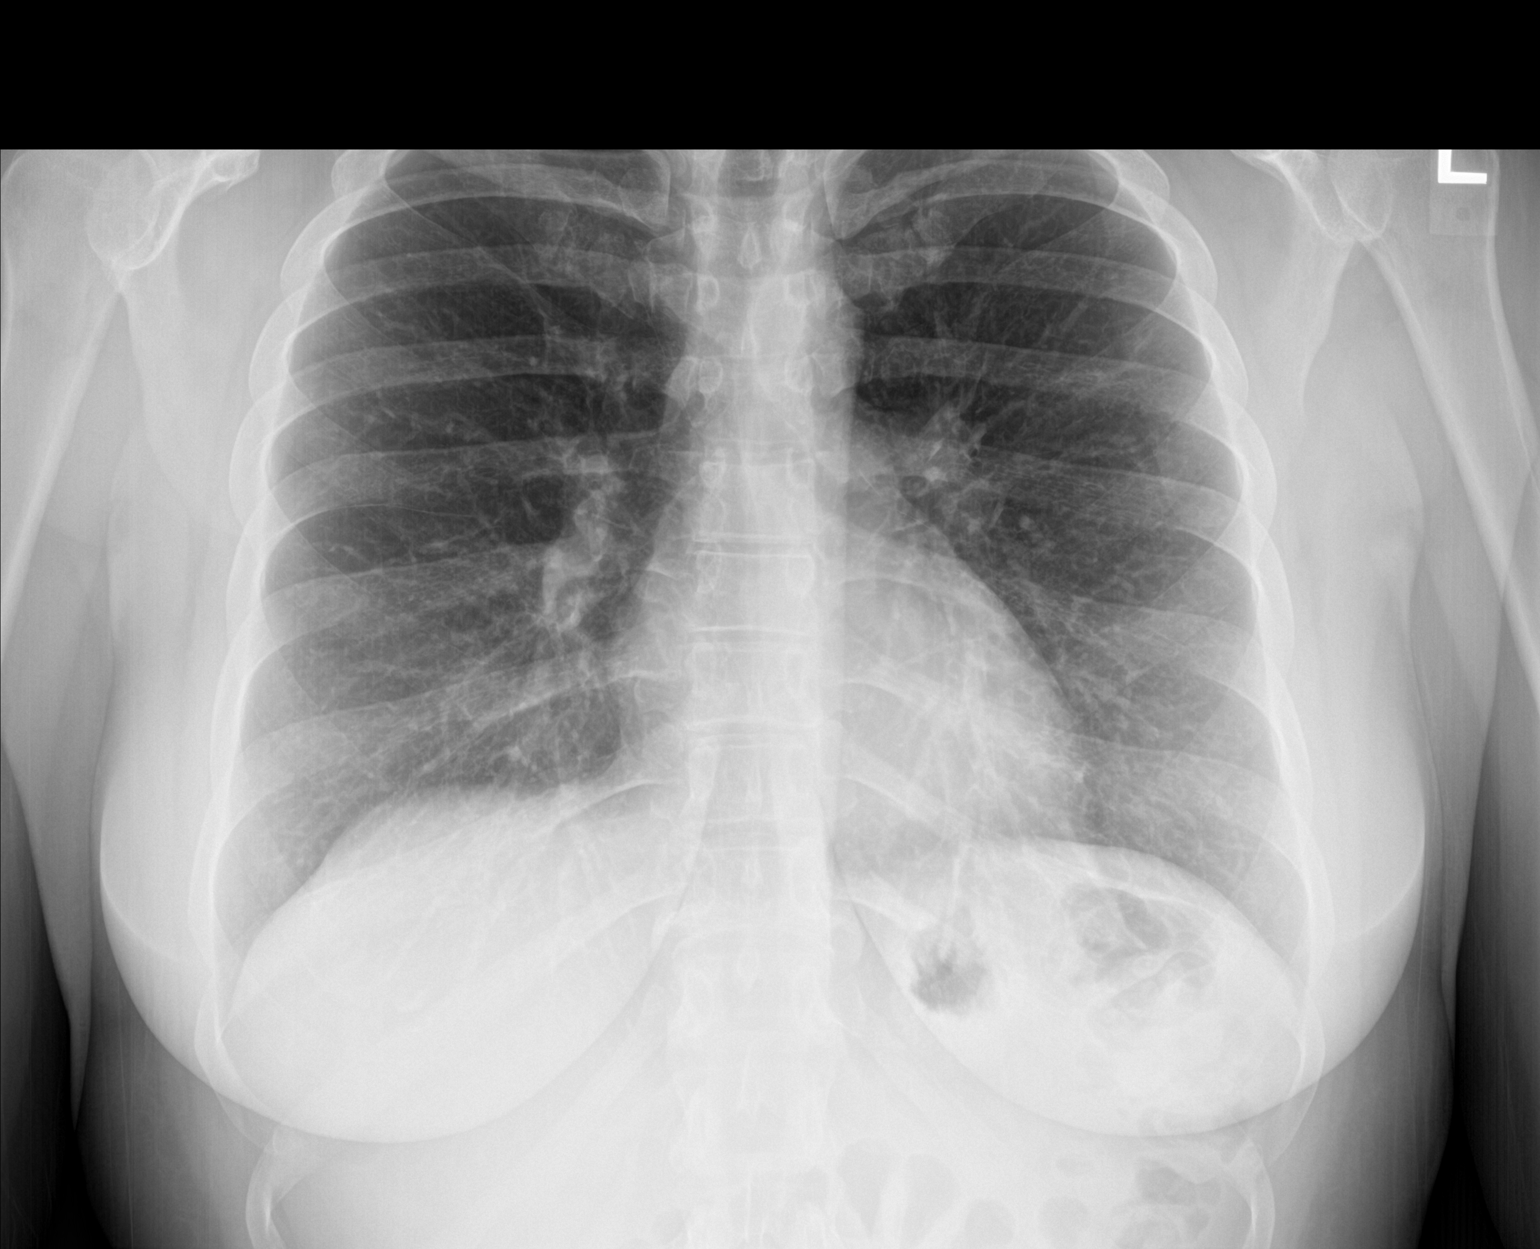

[chest lat]
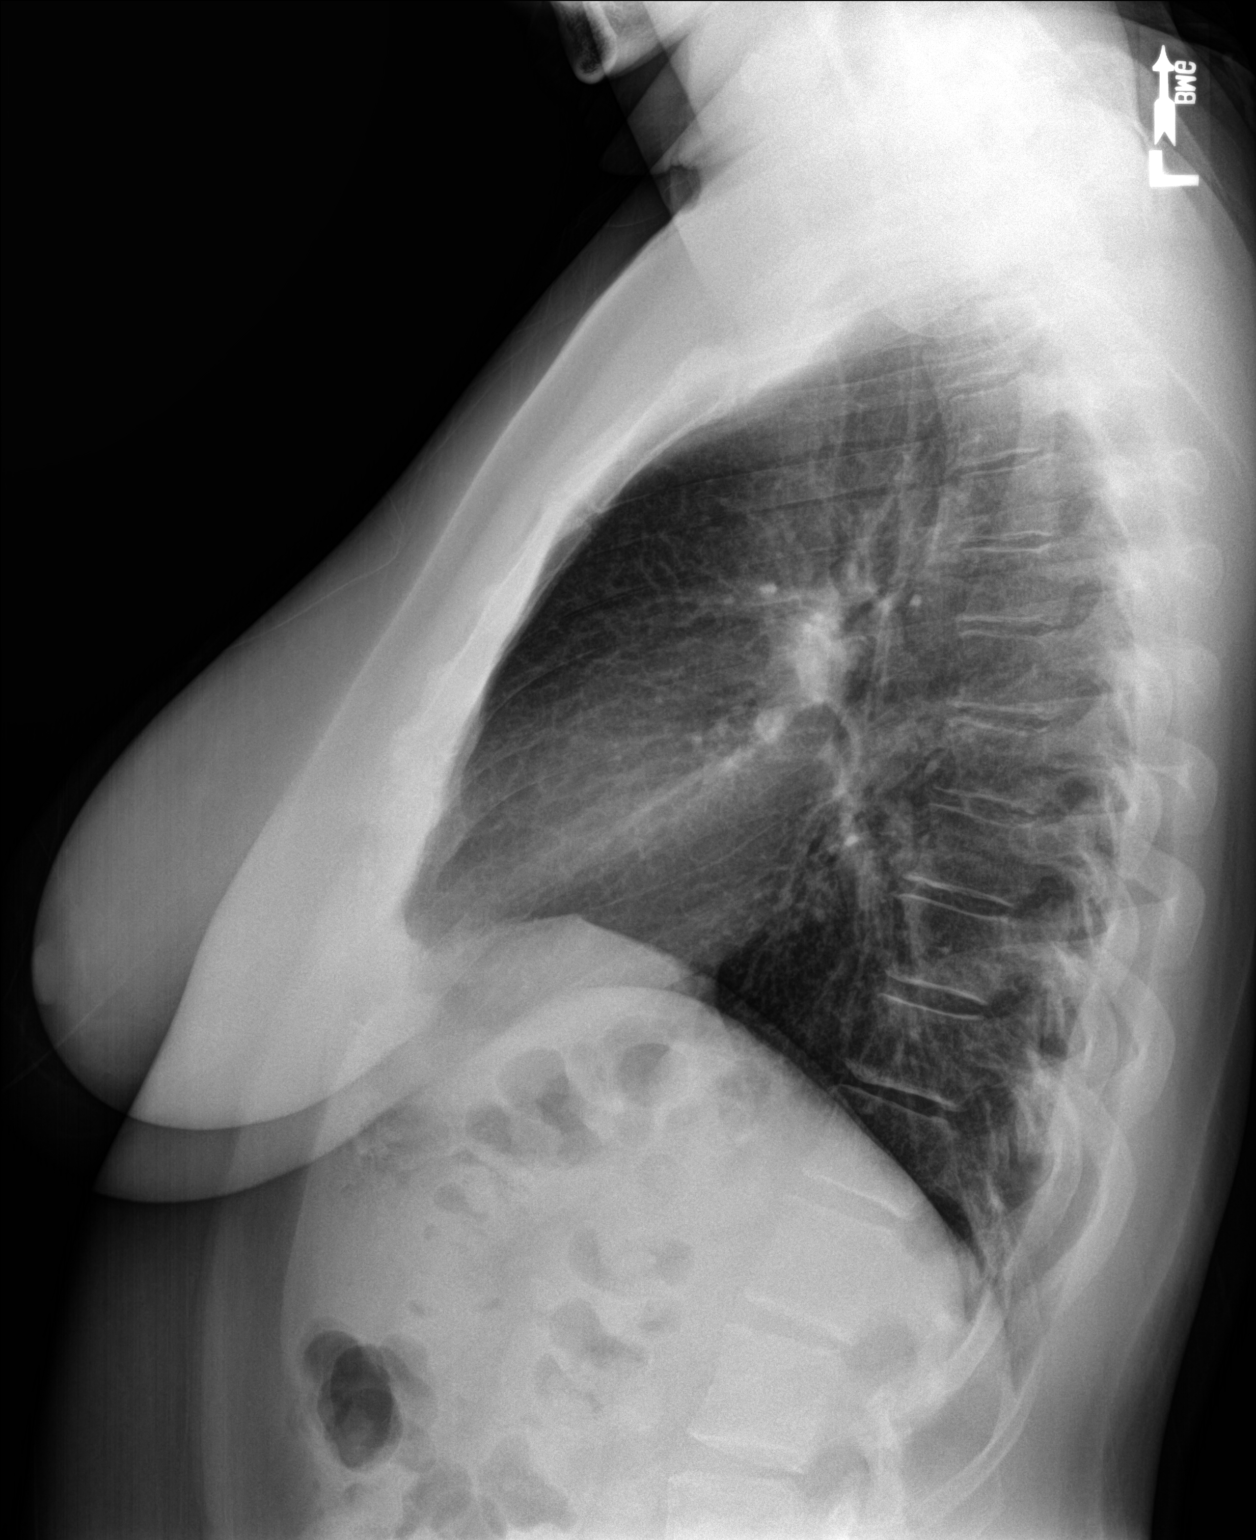

[2 of 2 positions shown; findings below may reference images not displayed]

FINDINGS: The heart size and mediastinal contours are within normal limits.
Both lungs are clear. The visualized skeletal structures are
unremarkable.
IMPRESSION: No active cardiopulmonary disease.

## 2018-04-07 IMAGING — CR DG ABDOMEN ACUTE W/ 1V CHEST
3 series · 3 of 3 positions shown · non-contrast
Comparison: 06/12/2016

CLINICAL DATA: Sharp left upper quadrant pain. Vomiting and
diarrhea beginning today.

EXAM:
DG ABDOMEN ACUTE W/ 1V CHEST

[w chest pa]
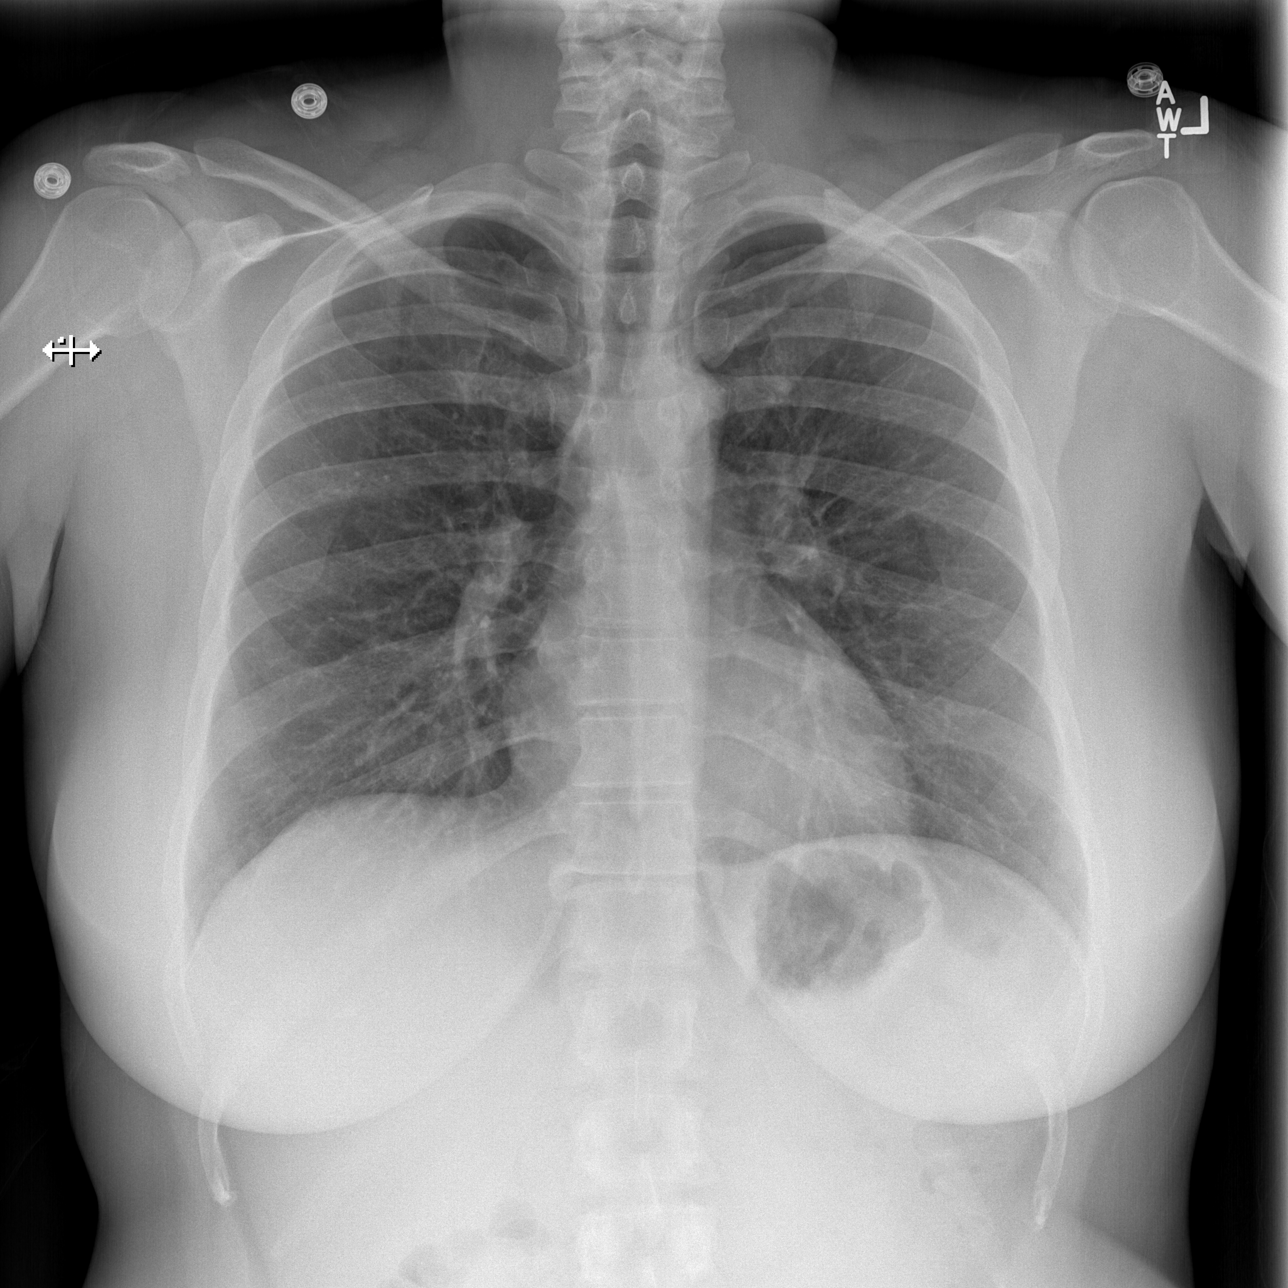

[w abdomen upright]
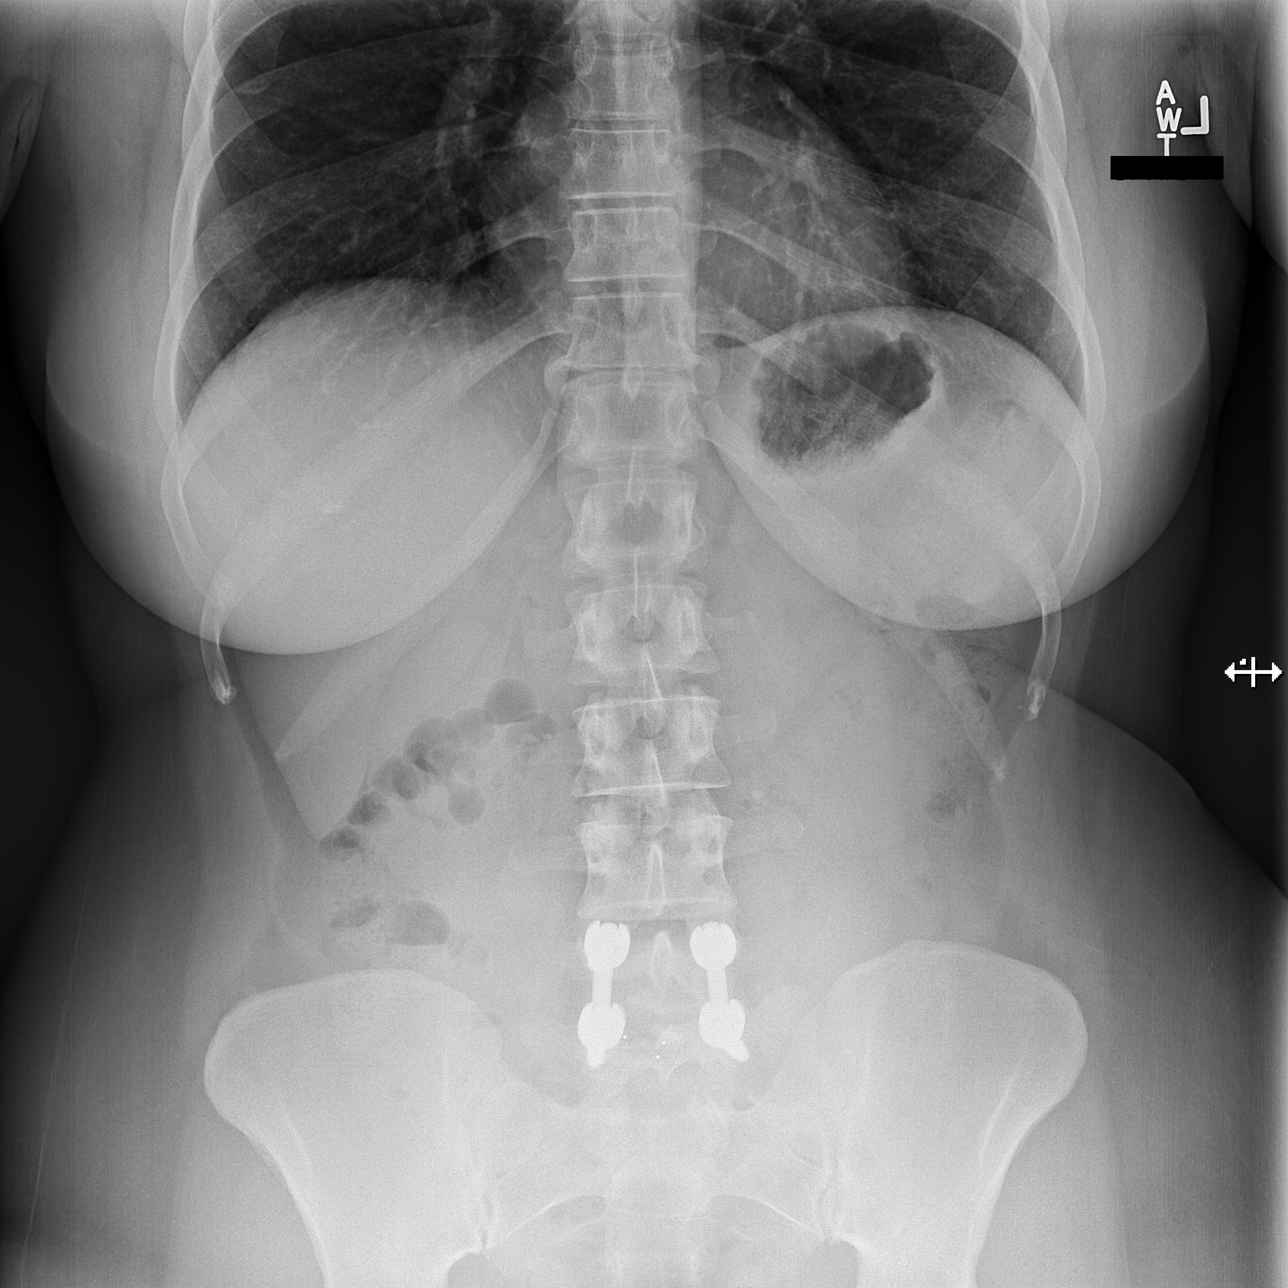

[t abdomen supine]
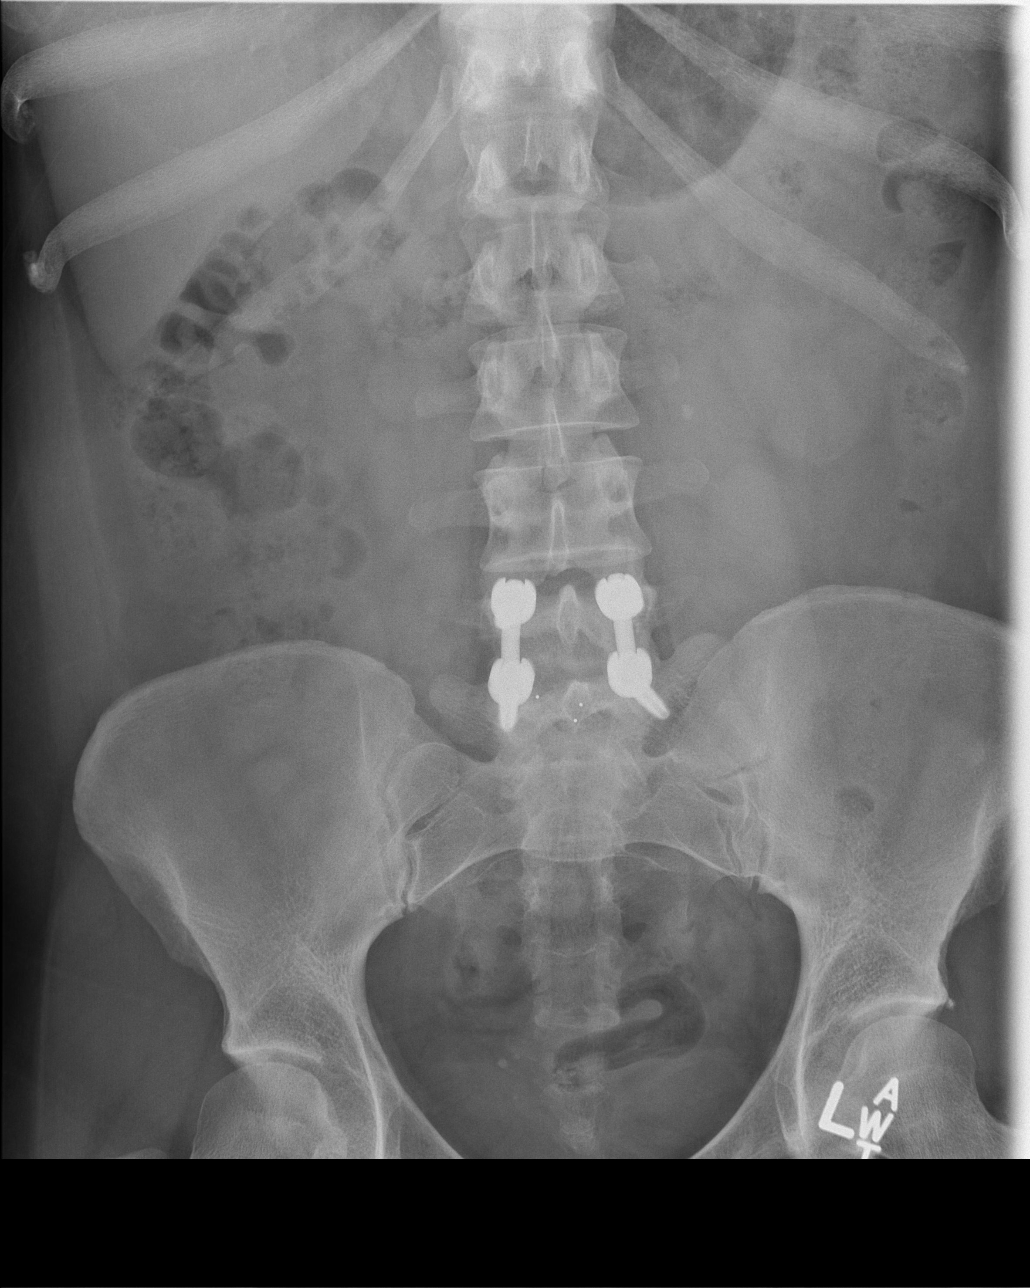

[3 of 3 positions shown; findings below may reference images not displayed]

FINDINGS: A 4 mm stone projects over the left psoas shadow just lateral to the
lower endplate of L2.

Normal bowel gas pattern. No free air. No other soft tissue
abnormality.

The heart, mediastinum and hila are unremarkable. The lungs are
clear.

There is a transitional lumbosacral vertebra, designated L5. There
has been a previous L5-S1 posterior lumbar spine fusion.
IMPRESSION: 1. 4 mm stone on the left suggests a proximal left ureteral stone
and may be the source of this patient's pain. This was not present
on abdominal radiographs dated 07/12/2013.
2. Normal bowel gas pattern. Specifically, no bowel obstruction or
adynamic ileus.
3. Normal frontal chest radiograph.

## 2018-06-13 ENCOUNTER — Ambulatory Visit: Payer: Self-pay | Admitting: *Deleted

## 2018-06-13 NOTE — Telephone Encounter (Signed)
Patient has appointment scheduled for am- she wants inhaler called to pharmacy- told her could not do that- advised keep appointment-or go to UC if gets worse.  Reason for Disposition . [1] MILD difficulty breathing (e.g., minimal/no SOB at rest, SOB with walking, pulse <100) AND [2] NEW-onset or WORSE than normal    Patient has an appointment in the morning- she wants to know if she can have inhaler called to pharmacy before the appointment. Told patient since she was Rx'd the inhaler by a provider that is no longer at practice- told her she needed to go to UC or keep appointment in am.  Answer Assessment - Initial Assessment Questions 1. RESPIRATORY STATUS: "Describe your breathing?" (e.g., wheezing, shortness of breath, unable to speak, severe coughing)      Chest congestion,cough makes her dizzy, shortness of breath 2. ONSET: "When did this breathing problem begin?"      Over 1 month 3. PATTERN "Does the difficult breathing come and go, or has it been constant since it started?"      Comes/goes with irritant  4. SEVERITY: "How bad is your breathing?" (e.g., mild, moderate, severe)    - MILD: No SOB at rest, mild SOB with walking, speaks normally in sentences, can lay down, no retractions, pulse < 100.    - MODERATE: SOB at rest, SOB with minimal exertion and prefers to sit, cannot lie down flat, speaks in phrases, mild retractions, audible wheezing, pulse 100-120.    - SEVERE: Very SOB at rest, speaks in single words, struggling to breathe, sitting hunched forward, retractions, pulse > 120      mild 5. RECURRENT SYMPTOM: "Have you had difficulty breathing before?" If so, ask: "When was the last time?" and "What happened that time?"      Last year- acute bronchitis  6. CARDIAC HISTORY: "Do you have any history of heart disease?" (e.g., heart attack, angina, bypass surgery, angioplasty)      no 7. LUNG HISTORY: "Do you have any history of lung disease?"  (e.g., pulmonary embolus, asthma,  emphysema)     no 8. CAUSE: "What do you think is causing the breathing problem?"      Chest congestion 9. OTHER SYMPTOMS: "Do you have any other symptoms? (e.g., dizziness, runny nose, cough, chest pain, fever)     Dizziness, cough, fever on/off 10. PREGNANCY: "Is there any chance you are pregnant?" "When was your last menstrual period?"       No-  LMP- the week before Thanksgiving 11. TRAVEL: "Have you traveled out of the country in the last month?" (e.g., travel history, exposures)       no  Protocols used: BREATHING DIFFICULTY-A-AH

## 2018-06-14 ENCOUNTER — Other Ambulatory Visit: Payer: Self-pay

## 2018-06-14 ENCOUNTER — Ambulatory Visit: Payer: BLUE CROSS/BLUE SHIELD | Admitting: Family Medicine

## 2018-06-14 ENCOUNTER — Ambulatory Visit (INDEPENDENT_AMBULATORY_CARE_PROVIDER_SITE_OTHER): Payer: BLUE CROSS/BLUE SHIELD

## 2018-06-14 ENCOUNTER — Encounter: Payer: Self-pay | Admitting: Family Medicine

## 2018-06-14 VITALS — BP 124/81 | HR 92 | Temp 98.5°F | Resp 20 | Ht 64.0 in | Wt 189.8 lb

## 2018-06-14 DIAGNOSIS — R0989 Other specified symptoms and signs involving the circulatory and respiratory systems: Secondary | ICD-10-CM | POA: Diagnosis not present

## 2018-06-14 DIAGNOSIS — R05 Cough: Secondary | ICD-10-CM | POA: Diagnosis not present

## 2018-06-14 DIAGNOSIS — R059 Cough, unspecified: Secondary | ICD-10-CM

## 2018-06-14 DIAGNOSIS — J4541 Moderate persistent asthma with (acute) exacerbation: Secondary | ICD-10-CM

## 2018-06-14 DIAGNOSIS — R062 Wheezing: Secondary | ICD-10-CM | POA: Diagnosis not present

## 2018-06-14 LAB — POCT CBC
GRANULOCYTE PERCENT: 70.8 % (ref 37–80)
HEMATOCRIT: 38.3 % (ref 29–41)
Hemoglobin: 13.3 g/dL (ref 9.5–13.5)
Lymph, poc: 2.6 (ref 0.6–3.4)
MCH: 33.3 pg — AB (ref 27–31.2)
MCHC: 34.8 g/dL (ref 31.8–35.4)
MCV: 95.7 fL (ref 76–111)
MID (CBC): 0.2 (ref 0–0.9)
MPV: 7.4 fL (ref 0–99.8)
POC GRANULOCYTE: 6.9 (ref 2–6.9)
POC LYMPH PERCENT: 27.2 %L (ref 10–50)
POC MID %: 2 %M (ref 0–12)
Platelet Count, POC: 371 10*3/uL (ref 142–424)
RBC: 4 M/uL — AB (ref 4.04–5.48)
RDW, POC: 13.3 %
WBC: 9.7 10*3/uL (ref 4.6–10.2)

## 2018-06-14 MED ORDER — DOXYCYCLINE HYCLATE 100 MG PO TABS: 100.0000 mg | ORAL_TABLET | Freq: Two times a day (BID) | ORAL | 0 refills | Status: DC

## 2018-06-14 MED ORDER — ALBUTEROL SULFATE HFA 108 (90 BASE) MCG/ACT IN AERS: 2.0000 | INHALATION_SPRAY | RESPIRATORY_TRACT | 1 refills | Status: DC | PRN

## 2018-06-14 MED ORDER — PREDNISONE 20 MG PO TABS: ORAL_TABLET | ORAL | 0 refills | Status: DC

## 2018-06-14 NOTE — Patient Instructions (Addendum)
  Drink plenty of fluids and get enough rest  Continue using an over-the-counter cough syrup DM, such as Mucinex DM, Robitussin-DM, Delsym, or 1 of the generics.  Use the albuterol inhaler 2 inhalations every 4 to 6 hours for cough and wheezing  Take the prednisone 20 mg 3 pills daily for 2 days, then 2 daily for 2 days, then 1 daily for 2 days for inflammation in lungs  Take doxycycline 100 mg 1 twice daily for infection  Stop smoking the marijuana.  It is only harming her lungs.  Get a flu shot in a few days when you are feeling better.  Return if worse     If you have lab work done today you will be contacted with your lab results within the next 2 weeks.  If you have not heard from [place] then please contact us. The fastest way to get your results is to register for My Chart.   IF you received an x-ray today, you will receive an invoice from [facility]. Please contact [facility] at [phone] with questions or concerns regarding your invoice.   IF you received labwork today, you will receive an invoice from [facility]. Please contact [facility] at [phone] with questions or concerns regarding your invoice.   Our billing staff will not be able to assist you with questions regarding bills from these companies.  You will be contacted with the lab results as soon as they are available. The fastest way not 000 to get your results is to activate your My Chart account. Instructions are located on the last page of this paperwork. If you have not heard from [place] regarding the results in 2 weeks, please contact this office.    The patient on a I need you to call him to [facility] if T8 H yes-man [phone] she thought she cannot know if she

## 2018-06-14 NOTE — Progress Notes (Signed)
Patient ID: Sheena Young, female    DOB: 11/01/1975  Age: 42 y.o. MRN: [id number]  Chief Complaint  Patient presents with  . Cough    on and off for 2 months   . Shortness of Breath  . Diarrhea    Subjective:   Ill-appearing lady 42 years old who comes in today with a history of having a cough on and off for the past couple of months.  She will start doing better and then get worse again.  She coughs quite hard.  The cough medicine up her sleep.  Others in the home and frequently been ill during this stretch of time.  She feels short of breath.  She has intermittent diarrhea, but has a history of irritable bowel syndrome and makes the changes and that hard to judge.  She smokes marijuana on about a daily basis.  It helps her aches and pains, but we briefly discussed that it hurts her lungs.  She does not smoke cigarettes.  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  She has taken over-the-counter cough syrup between Mucinex and Tylenol Cold and flu.  Current allergies, medications, problem list, past/family and social histories reviewed.  Objective:  BP 124/81   Pulse 92   Temp 98.5 F (36.9 C) (Oral)   Resp 20   Ht 5\' 4"  (1.626 m)   Wt 189 lb 12.8 oz (86.1 kg)   LMP 06/05/2018   SpO2 97%   BMI 32.58 kg/m   Ill-appearing lady with a persistent cough her TMs are normal.  Throat clear.  Neck supple without nodes.  Chest has diffuse rhonchi and wheezes throughout both lungs.  No rales.  Heart regular without murmur.  She goes to the [facility].  Assessment & Plan:   Assessment: 1. Wheezing   2. Moderate persistent asthmatic bronchitis with acute exacerbation   3. Cough   4. Rhonchi       Plan: Results for orders placed or performed in visit on 06/14/18  POCT CBC  Result Value Ref Range   WBC 9.7 4.6 - 10.2 K/uL   Lymph, poc 2.6 0.6 - 3.4   POC LYMPH PERCENT 27.2 10 - 50 %L   MID (cbc) 0.2 0 - 0.9   POC MID % 2.0 0 - 12 %M   POC Granulocyte  6.9 2 - 6.9   Granulocyte percent 70.8 37 - 80 %G   RBC 4.00 (A) 4.04 - 5.48 M/uL   Hemoglobin 13.3 9.5 - 13.5 g/dL   HCT, POC 38.3 29 - 41 %   MCV 95.7 76 - 111 fL   MCH, POC 33.3 (A) 27 - 31.2 pg   MCHC 34.8 31.8 - 35.4 g/dL   RDW, POC 13.3 %   Platelet Count, POC 371 142 - 424 K/uL   MPV 7.4 0 - 99.8 fL     Orders Placed This Encounter  Procedures  . DG Chest 2 View    Standing Status:   Future    Number of Occurrences:   1    Standing Expiration Date:   08/16/2019    Order Specific Question:   Reason for Exam (SYMPTOM  OR DIAGNOSIS REQUIRED)    Answer:   cough    Order Specific Question:   Is patient pregnant?    Answer:   No    Order Specific Question:   Preferred imaging location?    Answer:   External    Order  Specific Question:   Radiology Contrast Protocol - do NOT remove file path    Answer:   [email or web address]  . POCT CBC    Meds ordered this encounter  Medications  . doxycycline (VIBRA-TABS) 100 MG tablet    Sig: Take 1 tablet (100 mg total) by mouth 2 (two) times daily.    Dispense:  20 tablet    Refill:  0  . albuterol (PROVENTIL HFA;VENTOLIN HFA) 108 (90 Base) MCG/ACT inhaler    Sig: Inhale 2 puffs into the lungs every 4 (four) hours as needed for wheezing or shortness of breath (cough, shortness of breath or wheezing.).    Dispense:  1 Inhaler    Refill:  1  . predniSONE (DELTASONE) 20 MG tablet    Sig: Take 3 pills daily for 2 days, then 2 daily for 2 days, then 1 daily for 2 days.  Take after breakfast.    Dispense:  12 tablet    Refill:  0         Patient Instructions    Drink plenty of fluids and get enough rest  Continue using an over-the-counter cough syrup DM, such as Mucinex DM, Robitussin-DM, Delsym, or 1 of the generics.  Use the albuterol inhaler 2 inhalations every 4 to 6 hours for cough and wheezing  Take the prednisone 20 mg 3 pills daily for 2 days, then 2 daily for 2 days, then 1 daily  for 2 days for inflammation in lungs  Take doxycycline 100 mg 1 twice daily for infection  Stop smoking the marijuana.  It is only harming her lungs.  Get a flu shot in a few days when you are feeling better.  Return if worse     If you have lab work done today you will be contacted with your lab results within the next 2 weeks.  If you have not heard from [place] then please contact us. The fastest way to get your results is to register for My Chart.   IF you received an x-ray today, you will receive an invoice from [facility]. Please contact [facility] at [phone] with questions or concerns regarding your invoice.   IF you received labwork today, you will receive an invoice from [facility]. Please contact [facility] at [phone] with questions or concerns regarding your invoice.   Our billing staff will not be able to assist you with questions regarding bills from these companies.  You will be contacted with the lab results as soon as they are available. The fastest way not 000 to get your results is to activate your My Chart account. Instructions are located on the last page of this paperwork. If you have not heard from [place] regarding the results in 2 weeks, please contact this office.    The patient on a I need you to call him to [facility] if T8 H yes-man [phone] she thought she cannot know if she    Return in about 6 weeks (around 07/26/2018).   Ruben Reason, MD 06/14/2018

## 2018-06-14 NOTE — Telephone Encounter (Signed)
Pt seen today all issues handled at visit.

## 2018-07-26 ENCOUNTER — Other Ambulatory Visit: Payer: Self-pay

## 2018-07-26 ENCOUNTER — Ambulatory Visit (INDEPENDENT_AMBULATORY_CARE_PROVIDER_SITE_OTHER): Payer: BLUE CROSS/BLUE SHIELD

## 2018-07-26 ENCOUNTER — Encounter: Payer: Self-pay | Admitting: Family Medicine

## 2018-07-26 ENCOUNTER — Ambulatory Visit: Payer: BLUE CROSS/BLUE SHIELD | Admitting: Family Medicine

## 2018-07-26 VITALS — BP 116/80 | HR 79 | Temp 98.9°F | Resp 17 | Ht 64.0 in | Wt 194.0 lb

## 2018-07-26 DIAGNOSIS — R05 Cough: Secondary | ICD-10-CM

## 2018-07-26 DIAGNOSIS — J4541 Moderate persistent asthma with (acute) exacerbation: Secondary | ICD-10-CM | POA: Diagnosis not present

## 2018-07-26 DIAGNOSIS — R062 Wheezing: Secondary | ICD-10-CM | POA: Diagnosis not present

## 2018-07-26 DIAGNOSIS — R059 Cough, unspecified: Secondary | ICD-10-CM

## 2018-07-26 MED ORDER — ALBUTEROL SULFATE (2.5 MG/3ML) 0.083% IN NEBU
2.5000 mg | INHALATION_SOLUTION | Freq: Once | RESPIRATORY_TRACT | Status: AC
  Administered 2018-07-26: 2.5 mg via RESPIRATORY_TRACT

## 2018-07-26 MED ORDER — FLUTICASONE PROPIONATE HFA 44 MCG/ACT IN AERO: 1.0000 | INHALATION_SPRAY | Freq: Two times a day (BID) | RESPIRATORY_TRACT | 12 refills | Status: DC

## 2018-07-26 NOTE — Patient Instructions (Signed)
° ° ° °  If you have lab work done today you will be contacted with your lab results within the next 2 weeks.  If you have not heard from us then please contact us. The fastest way to get your results is to register for My Chart. ° ° °IF you received an x-ray today, you will receive an invoice from [facility]. Please contact [facility] at [phone] with questions or concerns regarding your invoice.  ° °IF you received labwork today, you will receive an invoice from [facility]. Please contact [facility] at [phone] with questions or concerns regarding your invoice.  ° °Our billing staff will not be able to assist you with questions regarding bills from these companies. ° °You will be contacted with the lab results as soon as they are available. The fastest way to get your results is to activate your My Chart account. Instructions are located on the last page of this paperwork. If you have not heard from us regarding the results in 2 weeks, please contact this office. °  ° ° ° °

## 2018-07-26 NOTE — Progress Notes (Signed)
1/14/202010:01 AM  Sheena Young 01-Jan-1976, 43 y.o. female [id number]  Chief Complaint  Patient presents with  . Follow-up    bronchitis and cough     HPI:   Patient is a 43 y.o. 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 1 1 No No  Number falls in past yr: 0 1 - -  Injury with Fall? 0 1 - -     Depression screen [facility] 2/9 07/26/2018 06/14/2018 09/18/2016  Decreased Interest 0 0 0  Down, Depressed, Hopeless 0 0 0  PHQ - 2 Score 0 0 0  Altered sleeping 0 - -  Tired, decreased energy 0 - -  Change in appetite 0 - -  Feeling bad or failure about yourself  0 - -  Trouble concentrating 0 - -  Moving slowly or fidgety/restless 0 - -  Suicidal thoughts 0 - -  PHQ-9 Score 0 - -  Difficult doing work/chores Not difficult at all - -    No Known Allergies  Prior to Admission medications   Medication Sig Start Date End Date Taking? Authorizing Provider  albuterol (PROVENTIL HFA;VENTOLIN HFA) 108 (90 Base) MCG/ACT inhaler Inhale 2 puffs into the lungs every 4 (four) hours as needed for wheezing or shortness of breath (cough, shortness of breath or wheezing.).  06/14/18  Yes Posey Boyer, MD  cetirizine (ZYRTEC) 10 MG tablet TAKE 1 TABLET(10 MG) BY MOUTH DAILY 09/14/17  Yes Wardell Honour, MD  Probiotic Product (PROBIOTIC DAILY PO) Take 1 tablet by mouth daily. Reported on 07/12/2015   Yes [provider]    Past Medical History:  Diagnosis Date  . Anxiety    but doesn't take any meds for this- ? panic attacks- not diagnosised  . Chronic back pain   . Gastritis    Chronic   . Gastritis, acute   . GERD (gastroesophageal reflux disease)    takes Zantac prn  . Headache(784.0)    occaionally  . History of kidney stones   . History of migraine    last one about 110months  . Insomnia   . Joint pain   . Joint swelling   . Overactive bladder    never filled prescription bc unable to afford  . Shortness of breath dyspnea    with anxiety  . Urinary frequency   . Urinary urgency   . Vomiting     Past Surgical History:  Procedure Laterality Date  . COLONOSCOPY W/ POLYPECTOMY    . ECTOPIC PREGNANCY SURGERY    . LUMBAR LAMINECTOMY/DECOMPRESSION MICRODISCECTOMY Right 11/03/2012   Procedure: LUMBAR LAMINECTOMY/DECOMPRESSION MICRODISCECTOMY  Right sided lumbar 4-5 microdisectomy;  Surgeon:  Sinclair Ship, MD;  Location: [place];  Service: Orthopedics;  Laterality: Right;  Right sided lumbar 4-5 microdisectomy  . peridontal surgery    . revision decompression  03/2015   decompression and fusion at L4/5, [facility]    Social History   Tobacco Use  . Smoking status: Former Smoker    Packs/day: 0.25    Years: 8.00    Pack years: 2.00    Types: E-cigarettes    Last attempt to quit: 03/11/2015    Years since quitting: 3.3  . Smokeless tobacco: Never Used  . Tobacco comment: of electronic cigarette  Substance Use Topics  . Alcohol use: Yes    Alcohol/week: 0.0 - 2.0 standard drinks    Comment: occasionally    Family History  Problem Relation Age of Onset  . Cervical cancer Sister   . Diabetes Maternal Grandmother   .  Congestive Heart Failure Maternal Grandmother   . Diabetes Maternal Aunt        x 3  . Diabetes Maternal Uncle        x 3  . Heart attack Paternal Grandfather   . Heart disease Maternal Uncle   . Colon polyps Neg Hx   . Colon cancer Neg Hx   . Liver disease Neg Hx   . Kidney disease Neg Hx     ROS Per hpi  OBJECTIVE:  Blood pressure 116/80, pulse 79, temperature 98.9 F (37.2 C), temperature source Oral, resp. rate 17, height 5\' 4"  (1.626 m), weight 194 lb (88 kg), last menstrual period 06/14/2018, SpO2 98 %, peak flow 350 L/min. Body mass index is 33.3 kg/m.   Physical Exam Vitals signs and nursing note reviewed.  Constitutional:      Appearance: She is well-developed.  HENT:     Head: Normocephalic and atraumatic.     Mouth/Throat:     Pharynx: No oropharyngeal exudate.  Eyes:     General: No scleral icterus.    Conjunctiva/sclera: Conjunctivae normal.     Pupils: Pupils are equal, round, and reactive to light.  Neck:     Musculoskeletal: Neck supple.  Cardiovascular:     Rate and Rhythm: Normal rate and regular rhythm.     Heart sounds: Normal heart sounds. No murmur. No friction rub. No gallop.   Pulmonary:     Effort: Pulmonary effort is normal.     Breath sounds: Wheezing present. No rhonchi or rales.  Skin:    General: Skin is warm and dry.  Neurological:     Mental Status: She is alert and oriented to person, place, and time.     Peak flow reading is 350, about 84% of predicted pre Peak flow reading is 420, - post   Dg Chest 2 View  Result Date: 07/26/2018 CLINICAL DATA:  cough EXAM: CHEST - 2 VIEW COMPARISON:  06/14/2018 FINDINGS: Lungs are clear. Heart size and mediastinal contours are within normal limits. No effusion. Visualized bones unremarkable. IMPRESSION: No acute cardiopulmonary disease. Electronically Signed   By: Lucrezia Europe M.D.   On: 07/26/2018 10:36     ASSESSMENT and PLAN  1. Moderate persistent asthmatic bronchitis with acute  exacerbation Improving but still with frequent albuterol use. Adding low dose ICS, reviewed r/se/b.   2. Wheezing - albuterol (PROVENTIL) (2.5 MG/3ML) 0.083% nebulizer solution 2.5 mg  3. Cough - DG Chest 2 View; Future  Other orders - fluticasone (FLOVENT HFA) 44 MCG/ACT inhaler; Inhale 1 puff into the lungs 2 (two) times daily.  Return in about 4 weeks (around 08/23/2018) for breathing.    Rutherford Guys, MD Primary Care at [place], [place] [address] Ph.  [phone] Fax [phone]

## 2018-08-22 ENCOUNTER — Ambulatory Visit (INDEPENDENT_AMBULATORY_CARE_PROVIDER_SITE_OTHER): Payer: BLUE CROSS/BLUE SHIELD | Admitting: Family Medicine

## 2018-08-22 ENCOUNTER — Encounter: Payer: Self-pay | Admitting: Family Medicine

## 2018-08-22 ENCOUNTER — Other Ambulatory Visit: Payer: Self-pay

## 2018-08-22 VITALS — BP 118/77 | HR 77 | Temp 99.3°F | Resp 16 | Ht 64.0 in | Wt 194.0 lb

## 2018-08-22 DIAGNOSIS — J4541 Moderate persistent asthma with (acute) exacerbation: Secondary | ICD-10-CM

## 2018-08-22 MED ORDER — FLUTICASONE PROPIONATE HFA 44 MCG/ACT IN AERO: 2.0000 | INHALATION_SPRAY | Freq: Two times a day (BID) | RESPIRATORY_TRACT | 12 refills | Status: DC

## 2018-08-22 MED ORDER — CETIRIZINE HCL 10 MG PO TABS: ORAL_TABLET | ORAL | 3 refills | Status: DC

## 2018-08-22 MED ORDER — MONTELUKAST SODIUM 10 MG PO TABS: 10.0000 mg | ORAL_TABLET | Freq: Every day | ORAL | 3 refills | Status: DC

## 2018-08-22 NOTE — Progress Notes (Signed)
2/10/202010:01 AM  Sheena Young 1976-06-13, 43 y.o. female [id number]  Chief Complaint  Patient presents with  . Bronchitis    follow up/ pt states she is not completely better, ear pain in left ear and cough.  . Medication Refill    zytec    HPI:   Patient is a 43 y.o. female with past medical history significant for asthma who presents today for followup after starting low dose ICS  Peak flow at last visit before starting ICS was 350 Reports breathing is better, feels that nocturnal wheezing has resolved Albuterol helps with cough Still having nasal congestion, PND Sneezing lots, specially is exposed smells, outdoor Having occ itchy eyes, not watery No itchy ears or throat Does have ear fullness Everybody at home sick Smokes THC No h/o asthma  Fall Risk  08/22/2018 07/26/2018 06/14/2018 09/18/2016 06/12/2016  Falls in the past year? 0 1 1 No No  Number falls in past yr: 0 0 1 - -  Injury with Fall? 0 0 1 - -     Depression screen [facility] 2/9 08/22/2018 07/26/2018 06/14/2018  Decreased Interest 0 0 0  Down, Depressed, Hopeless 0 0 0  PHQ - 2 Score 0 0 0  Altered sleeping - 0 -  Tired, decreased energy - 0 -  Change in appetite - 0 -  Feeling bad or failure about yourself  - 0 -  Trouble concentrating - 0 -  Moving slowly or fidgety/restless - 0 -  Suicidal thoughts - 0 -  PHQ-9 Score - 0 -  Difficult doing work/chores - Not difficult at all -    No Known Allergies  Prior to Admission medications   Medication Sig Start Date End Date Taking? Authorizing Provider  albuterol (PROVENTIL HFA;VENTOLIN HFA) 108 (90 Base) MCG/ACT inhaler Inhale 2 puffs into the lungs every 4 (four) hours as needed for wheezing or shortness of breath (cough, shortness of breath or wheezing.). 06/14/18  Yes Posey Boyer, MD  cetirizine (ZYRTEC) 10 MG tablet TAKE 1 TABLET(10 MG) BY MOUTH DAILY 09/14/17  Yes Wardell Honour, MD  fluticasone (FLOVENT HFA) 44 MCG/ACT inhaler Inhale 1 puff into  the lungs 2 (two) times daily. 07/26/18  Yes Rutherford Guys, MD  Probiotic Product (PROBIOTIC DAILY PO) Take 1 tablet by mouth daily. Reported on 07/12/2015   Yes [provider]    Past Medical History:  Diagnosis Date  . Anxiety    but doesn't take any meds for this- ? panic attacks- not diagnosised  . Chronic back pain   . Gastritis    Chronic   . Gastritis, acute   . GERD (gastroesophageal reflux disease)    takes Zantac prn  . Headache(784.0)    occaionally  . History of kidney stones   . History of migraine    last one about 77months  . Insomnia   . Joint pain   . Joint swelling   . Overactive bladder    never filled prescription bc unable to afford  . Shortness of breath dyspnea    with anxiety  . Urinary frequency   . Urinary urgency   . Vomiting     Past Surgical History:  Procedure Laterality Date  . COLONOSCOPY W/ POLYPECTOMY    . ECTOPIC PREGNANCY SURGERY    . LUMBAR LAMINECTOMY/DECOMPRESSION MICRODISCECTOMY Right 11/03/2012   Procedure: LUMBAR LAMINECTOMY/DECOMPRESSION MICRODISCECTOMY  Right sided lumbar 4-5 microdisectomy;  Surgeon: Sinclair Ship, MD;  Location: [place];  Service: Orthopedics;  Laterality: Right;  Right sided lumbar 4-5 microdisectomy  . peridontal surgery    . revision decompression  03/2015   decompression and fusion at L4/5, [facility]    Social History   Tobacco Use  . Smoking status: Former Smoker    Packs/day: 0.25    Years: 8.00    Pack years: 2.00    Types: E-cigarettes    Last attempt to quit: 03/11/2015    Years since quitting: 3.4  . Smokeless tobacco: Never Used  . Tobacco comment: of electronic cigarette  Substance Use Topics  . Alcohol use: Yes    Alcohol/week: 0.0 - 2.0 standard drinks    Comment: occasionally    Family History  Problem Relation Age of Onset  . Cervical cancer Sister   . Diabetes Maternal Grandmother   . Congestive Heart Failure Maternal Grandmother   . Diabetes Maternal Aunt         x 3  . Diabetes Maternal Uncle        x 3  . Heart attack Paternal Grandfather   . Heart disease Maternal Uncle   . Colon polyps Neg Hx   . Colon cancer Neg Hx   . Liver disease Neg Hx   . Kidney disease Neg Hx     ROS Per hpi  OBJECTIVE:  Blood pressure 118/77, pulse 77, temperature 99.3 F (37.4 C), temperature source Oral, resp. rate 16, height 5\' 4"  (1.626 m), weight 194 lb (88 kg), SpO2 97 %. Body mass index is 33.3 kg/m.   Physical Exam Vitals signs and nursing note reviewed.  Constitutional:      Appearance: She is well-developed.  HENT:     Head: Normocephalic and atraumatic.     Right Ear: Hearing, tympanic membrane, ear canal and external ear normal.     Left Ear: Hearing, tympanic membrane, ear canal and external ear normal.     Nose:     Right Sinus: No maxillary sinus tenderness or frontal sinus tenderness.     Left Sinus: No maxillary sinus tenderness or frontal sinus tenderness.  Eyes:     Conjunctiva/sclera: Conjunctivae normal.     Pupils: Pupils are equal, round, and reactive to light.  Neck:     Musculoskeletal: Neck supple.  Cardiovascular:     Rate and Rhythm: Normal rate and regular rhythm.     Heart sounds: Normal heart sounds. No murmur. No friction rub. No gallop.   Pulmonary:     Effort: Pulmonary effort is normal.     Breath sounds: Normal breath sounds. No wheezing or rales.  Lymphadenopathy:     Cervical: No cervical adenopathy.  Skin:    General: Skin is warm and dry.  Neurological:     Mental Status: She is alert and oriented to person, place, and time.     Peak flow reading is 300, about 72 % of predicted.  ASSESSMENT and PLAN  1. Moderate persistent asthmatic bronchitis with acute exacerbation Improved but not at goal. Increasing flovent 50mcg to 2 puff BID. Adding singulair. Referring to pulm for formal eval and treatment - Ambulatory referral to Pulmonology  Other orders - Check Peak Flow - cetirizine (ZYRTEC) 10 MG  tablet; TAKE 1 TABLET(10 MG) BY MOUTH DAILY - montelukast (SINGULAIR) 10 MG tablet; Take 1 tablet (10 mg total) by mouth at bedtime. - fluticasone (FLOVENT HFA) 44 MCG/ACT inhaler; Inhale 2 puffs into the lungs 2 (two) times daily.  Return for after pulm.    Rutherford Guys, MD  Primary Care at [place], [place] [address] Ph.  [phone] Fax [phone]

## 2018-08-22 NOTE — Patient Instructions (Signed)
° ° ° °  If you have lab work done today you will be contacted with your lab results within the next 2 weeks.  If you have not heard from us then please contact us. The fastest way to get your results is to register for My Chart. ° ° °IF you received an x-ray today, you will receive an invoice from [facility]. Please contact [facility] at [phone] with questions or concerns regarding your invoice.  ° °IF you received labwork today, you will receive an invoice from [facility]. Please contact [facility] at [phone] with questions or concerns regarding your invoice.  ° °Our billing staff will not be able to assist you with questions regarding bills from these companies. ° °You will be contacted with the lab results as soon as they are available. The fastest way to get your results is to activate your My Chart account. Instructions are located on the last page of this paperwork. If you have not heard from us regarding the results in 2 weeks, please contact this office. °  ° ° ° °

## 2018-09-08 ENCOUNTER — Ambulatory Visit: Payer: BLUE CROSS/BLUE SHIELD | Admitting: Internal Medicine

## 2018-09-08 ENCOUNTER — Encounter: Payer: Self-pay | Admitting: Internal Medicine

## 2018-09-08 VITALS — BP 118/76 | HR 81 | Ht 64.0 in | Wt 193.0 lb

## 2018-09-08 DIAGNOSIS — R05 Cough: Secondary | ICD-10-CM | POA: Diagnosis not present

## 2018-09-08 DIAGNOSIS — J45991 Cough variant asthma: Secondary | ICD-10-CM | POA: Insufficient documentation

## 2018-09-08 DIAGNOSIS — R059 Cough, unspecified: Secondary | ICD-10-CM

## 2018-09-08 LAB — CBC WITH DIFFERENTIAL/PLATELET
Basophils Absolute: 0.1 10*3/uL (ref 0.0–0.1)
Basophils Relative: 0.6 % (ref 0.0–3.0)
EOS PCT: 1.2 % (ref 0.0–5.0)
Eosinophils Absolute: 0.1 10*3/uL (ref 0.0–0.7)
HCT: 40.1 % (ref 36.0–46.0)
Hemoglobin: 13.5 g/dL (ref 12.0–15.0)
LYMPHS ABS: 2.8 10*3/uL (ref 0.7–4.0)
Lymphocytes Relative: 25.1 % (ref 12.0–46.0)
MCHC: 33.6 g/dL (ref 30.0–36.0)
MCV: 96.3 fl (ref 78.0–100.0)
MONO ABS: 0.8 10*3/uL (ref 0.1–1.0)
Monocytes Relative: 6.9 % (ref 3.0–12.0)
NEUTROS ABS: 7.5 10*3/uL (ref 1.4–7.7)
NEUTROS PCT: 66.2 % (ref 43.0–77.0)
PLATELETS: 348 10*3/uL (ref 150.0–400.0)
RBC: 4.17 Mil/uL (ref 3.87–5.11)
RDW: 13.8 % (ref 11.5–15.5)
WBC: 11.3 10*3/uL — ABNORMAL HIGH (ref 4.0–10.5)

## 2018-09-08 LAB — NITRIC OXIDE: Nitric Oxide: <5

## 2018-09-08 MED ORDER — FAMOTIDINE 20 MG PO TABS: ORAL_TABLET | ORAL | 11 refills | Status: DC

## 2018-09-08 MED ORDER — BUDESONIDE-FORMOTEROL FUMARATE 80-4.5 MCG/ACT IN AERO: 2.0000 | INHALATION_SPRAY | Freq: Two times a day (BID) | RESPIRATORY_TRACT | 0 refills | Status: DC

## 2018-09-08 MED ORDER — BUDESONIDE-FORMOTEROL FUMARATE 80-4.5 MCG/ACT IN AERO: 2.0000 | INHALATION_SPRAY | Freq: Two times a day (BID) | RESPIRATORY_TRACT | 11 refills | Status: DC

## 2018-09-08 MED ORDER — RABEPRAZOLE SODIUM 20 MG PO TBEC: DELAYED_RELEASE_TABLET | ORAL | 2 refills | Status: DC

## 2018-09-08 NOTE — Progress Notes (Signed)
Sheena Young, female    DOB: 04/21/1976, 43 y.o.   MRN: [id number]   Brief patient profile:  42 yowf quit smoking 2016 with onset of doe / cough worse in winter rx zyrtec nightly / albuterol prn better in summer and never refilled then much worse p ? Sheena Young Thanksgiving 2019  with nasal/ear congestion and worse doe/cough prod thick yellow rx zpak and doxy and no better / prednisone improved then relapsed so referred to pulmonary clinic 09/08/2018 by Dr   Pamella Pert     History of Present Illness  09/08/2018  Pulmonary/ 1st office eval/Sheena Young  Chief Complaint  Patient presents with  . Pulmonary Consult    referred by Dr. Pamella Pert (PCP) for asthmatic bronchitis; on Flovent two puffs BID; reports only uses albuterol ONCE since prescribed Flovent (1 mo ago)  Dyspnea:  Shopping ok grocery store / walking dog uphill Cough: some better on tussin otc / worse with activity Sleep: doing better on side / bed is flat SABA use:  Rarely needing now but helps cough and sob when uses as needed Cough persists greenish esp in am and cough to point of vomit x years  Sneezing is severe also on zyrtec maint rx  Has "gastris" x years with IBS intol to most ppi's but has not tried aciphex or pepcid otc  Using lots of tums and mints   No obvious day to day or daytime variability or assoc   mucus plugs or hemoptysis or cp or chest tightness, subjective wheeze   Sleeping better now  without nocturnal  or early am exacerbation  of respiratory  c/o's or need for noct saba. Also denies any obvious fluctuation of symptoms with  environmental changes or other aggravating or alleviating factors except as outlined above   No unusual exposure hx or h/o childhood pna/ asthma or knowledge of premature birth.  Current Allergies, Complete Past Medical History, Past Surgical History, Family History, and Social History were reviewed in [employer] record.  ROS  The following are not active complaints  unless bolded Hoarseness, sore throat, dysphagia, dental problems, itching, sneezing,  nasal congestion or discharge of excess mucus or purulent secretions, ear ache,   fever, chills, sweats, unintended wt loss or wt gain, classically pleuritic or exertional cp,  orthopnea pnd or arm/hand swelling  or leg swelling, presyncope, palpitations, abdominal pain, anorexia, nausea, vomiting, diarrhea  or change in bowel habits or change in bladder habits, change in stools or change in urine, dysuria, hematuria,  rash, arthralgias, visual complaints, headache, numbness, weakness or ataxia or problems with walking or coordination,  change in mood or  memory.                  Past Medical History:  Diagnosis Date  . Anxiety    but doesn't take any meds for this- ? panic attacks- not diagnosised  . Chronic back pain   . Gastritis    Chronic   . Gastritis, acute   . GERD (gastroesophageal reflux disease)    takes Zantac prn  . Headache(784.0)    occaionally  . History of kidney stones   . History of migraine    last one about 60months  . Insomnia   . Joint pain   . Joint swelling   . Overactive bladder    never filled prescription bc unable to afford  . Shortness of breath dyspnea    with anxiety  . Urinary frequency   . Urinary urgency   .  Vomiting     Outpatient Medications Prior to Visit  Medication Sig Dispense Refill  . albuterol (PROVENTIL HFA;VENTOLIN HFA) 108 (90 Base) MCG/ACT inhaler Inhale 2 puffs into the lungs every 4 (four) hours as needed for wheezing or shortness of breath (cough, shortness of breath or wheezing.). 1 Inhaler 1  . cetirizine (ZYRTEC) 10 MG tablet TAKE 1 TABLET(10 MG) BY MOUTH DAILY 90 tablet 3  . fluticasone (FLOVENT HFA) 44 MCG/ACT inhaler Inhale 2 puffs into the lungs 2 (two) times daily. 1 Inhaler 12  . montelukast (SINGULAIR) 10 MG tablet Take 1 tablet (10 mg total) by mouth at bedtime. 30 tablet 3  . Probiotic Product (PROBIOTIC DAILY PO) Take 1  tablet by mouth daily. Reported on 07/12/2015        Objective:     BP 118/76 (BP Location: Left Arm, Cuff Size: Normal)   Pulse 81   Ht 5\' 4"  (1.626 m)   Wt 193 lb (87.5 kg)   SpO2 95%   BMI 33.13 kg/m   SpO2: 95 %  RA  Pleasant mod anxious wf nad  HEENT: nl dentition, turbinates bilaterally, and oropharynx. Nl external ear canals without cough reflex and perforated Left TM    NECK :  without JVD/Nodes/TM/ nl carotid upstrokes bilaterally   LUNGS: no acc muscle use,  Nl contour chest which is clear to A and P bilaterally with cough on insp >  exp maneuvers   CV:  RRR  no s3 or murmur or increase in P2, and no edema   ABD:  soft and nontender with nl inspiratory excursion in the supine position. No bruits or organomegaly appreciated, bowel sounds nl  MS:  Nl gait/ ext warm without deformities, calf tenderness, cyanosis or clubbing No obvious joint restrictions   SKIN: warm and dry without lesions    NEURO:  alert, approp, nl sensorium with  no motor or cerebellar deficits apparent.       I personally reviewed images and agree with radiology impression as follows:  CXR:   07/26/18 No acute cardiopulmonary disease.      Assessment   Cough variant asthma  vs UACS Onset 2016 when quit smoking  - Spirometry 09/08/2018  FEV1 2.7 (89%)  Ratio 0.91 s curvature - 09/08/2018  After extensive coaching inhaler device,  effectiveness =    75%   (short ti) try change to symb 80 2bid - Allergy profile 09/08/2018 >  Eos 0. /  IgE   - Sinus CT 09/08/2018 >>>     DDX of  difficult airways management almost all start with A and  include Adherence, Ace Inhibitors, Acid Reflux, Active Sinus Disease, Alpha 1 Antitripsin deficiency, Anxiety masquerading as Airways dz,  ABPA,  Allergy(esp in young), Aspiration (esp in elderly), Adverse effects of meds,  Active smoking or vaping, A bunch of PE's (a small clot burden can't cause this syndrome unless there is already severe underlying pulm  or vascular dz with poor reserve) plus two Bs  = Bronchiectasis and Beta blocker use..and one C= CHF    Adherence is always the initial "prime suspect" and is a multilayered concern that requires a "trust but verify" approach in every patient - starting with knowing how to use medications, especially inhalers, correctly, keeping up with refills and understanding the fundamental difference between maintenance and prns vs those medications only taken for a very short course and then stopped and not refilled.  - see hfa teaching - return with all meds in  hand using a trust but verify approach to confirm accurate Medication  Reconciliation The principal here is that until we are certain that the  patients are doing what we've asked, it makes no sense to ask them to do more.   ? Acid (or non-acid) GERD > always difficult to exclude as up to 75% of pts in some series report no assoc GI/ Heartburn symptoms> rec max (24h)  acid suppression and diet restrictions/ reviewed and instructions given in writing.   ? Active sinus CT > ct sinus  ? Allergy  >  Allergy profile   ? Anxiety > usually at the bottom of this list of usual suspects but should be   higher on this pt's based on H and P and   may interfere with adherence and also interpretation of response or lack thereof to symptom management which can be quite subjective.    For now try symb 80 2bid and max rx for gerd then regroup in 6 weeks ? Needs ENT/ GI input also if symptoms remains refractory    Total time devoted to counseling  > 50 % of initial 60 min office visit:  review case with pt/  device teaching which extended face to face time for this visit  discussion of options/alternatives/ personally creating written customized instructions  in presence of pt  then going over those specific  Instructions directly with the pt including how to use all of the meds but in particular covering each new medication in detail and the difference between the  maintenance= "automatic" meds and the prns using an action plan format for the latter (If this problem/symptom => do that organization reading Left to right).  Please see AVS from this visit for a full list of these instructions which I personally wrote for this pt and  are unique to this visit.       Christinia Gully, MD 09/08/2018

## 2018-09-08 NOTE — Assessment & Plan Note (Addendum)
Onset 2016 when quit smoking  - Spirometry 09/08/2018  FEV1 2.7 (89%)  Ratio 0.91 s curvature - 09/08/2018  After extensive coaching inhaler device,  effectiveness =    75%   (short ti) try change to symb 80 2bid - Allergy profile 09/08/2018 >  Eos 0. /  IgE   - Sinus CT 09/08/2018 >>>     DDX of  difficult airways management almost all start with A and  include Adherence, Ace Inhibitors, Acid Reflux, Active Sinus Disease, Alpha 1 Antitripsin deficiency, Anxiety masquerading as Airways dz,  ABPA,  Allergy(esp in young), Aspiration (esp in elderly), Adverse effects of meds,  Active smoking or vaping, A bunch of PE's (a small clot burden can't cause this syndrome unless there is already severe underlying pulm or vascular dz with poor reserve) plus two Bs  = Bronchiectasis and Beta blocker use..and one C= CHF    Adherence is always the initial "prime suspect" and is a multilayered concern that requires a "trust but verify" approach in every patient - starting with knowing how to use medications, especially inhalers, correctly, keeping up with refills and understanding the fundamental difference between maintenance and prns vs those medications only taken for a very short course and then stopped and not refilled.  - see hfa teaching - return with all meds in hand using a trust but verify approach to confirm accurate Medication  Reconciliation The principal here is that until we are certain that the  patients are doing what we've asked, it makes no sense to ask them to do more.   ? Acid (or non-acid) GERD > always difficult to exclude as up to 75% of pts in some series report no assoc GI/ Heartburn symptoms> rec max (24h)  acid suppression and diet restrictions/ reviewed and instructions given in writing.   ? Active sinus CT > ct sinus  ? Allergy  > doubt allergic asthma with such a low feno but could have allergic rhinitis contributing >>  Allergy profile / continue singulair for now plus zyrtec  ?  Anxiety > usually at the bottom of this list of usual suspects but should be   higher on this pt's based on H and P and   may interfere with adherence and also interpretation of response or lack thereof to symptom management which can be quite subjective.    For now try symb 80 2bid and max rx for gerd then regroup in 6 weeks ? Needs ENT/ GI input also if symptoms remains refractory    Total time devoted to counseling  > 50 % of initial 60 min office visit:  review case with pt/  device teaching which extended face to face time for this visit  discussion of options/alternatives/ personally creating written customized instructions  in presence of pt  then going over those specific  Instructions directly with the pt including how to use all of the meds but in particular covering each new medication in detail and the difference between the maintenance= "automatic" meds and the prns using an action plan format for the latter (If this problem/symptom => do that organization reading Left to right).  Please see AVS from this visit for a full list of these instructions which I personally wrote for this pt and  are unique to this visit.

## 2018-09-08 NOTE — Patient Instructions (Addendum)
Stop flovent   Plan A = Automatic = Symbicort 80 Take 2 puffs first thing in am and then another 2 puffs about 12 hours later.   Work on inhaler technique:  relax and gently blow all the way out then take a nice smooth deep breath back in, triggering the inhaler at same time you start breathing in.  Hold for up to 5 seconds if you can. Blow out thru nose. Rinse and gargle with water when done     Plan B = Backup Only use your albuterol (PROAIR) inhaler as a rescue medication to be used if you can't catch your breath by resting or doing a relaxed purse lip breathing pattern.  - The less you use it, the better it will work when you need it. - Ok to use the inhaler up to 2 puffs  every 4 hours if you must but call for appointment if use goes up over your usual need - Don't leave home without it !!  (think of it like the spare tire for your car)    Aciphex 20 mg   Take  30-60 min before first meal of the day and Pepcid (famotidine)  20 mg one hour before bedtime until return to office - this is the best way to tell whether stomach acid is contributing to your problem.     GERD (REFLUX)  is an extremely common cause of respiratory symptoms just like yours , many times with no obvious heartburn at all.    It can be treated with medication, but also with lifestyle changes including elevation of the head of your bed (ideally with 6 -8inch blocks under the headboard of your bed),  Smoking cessation, avoidance of late meals, excessive alcohol, and avoid fatty foods, chocolate, peppermint, colas, red wine, and acidic juices such as orange juice.  NO MINT OR MENTHOL PRODUCTS SO NO COUGH DROPS  USE SUGARLESS CANDY INSTEAD (Jolley ranchers or Stover's or Life Savers) or even ice chips will also do - the key is to swallow to prevent all throat clearing. NO OIL BASED VITAMINS - use powdered substitutes.  Avoid fish oil when coughing.   Please see patient coordinator before you leave today  to schedule sinus  CT   Please remember to go to the lab department   for your tests - we will call you with the results when they are available.      Please schedule a follow up office visit in 6 weeks, call sooner if needed

## 2018-09-08 NOTE — Addendum Note (Signed)
Addended by: Suzzanne Cloud E on: 09/08/2018 11:29 AM   Modules accepted: Orders

## 2018-09-09 LAB — RESPIRATORY ALLERGY PROFILE REGION II ~~LOC~~
ALLERGEN, D PTERNOYSSINUS, D1: 0.13 kU/L — AB
ALLERGEN, P. NOTATUM, M1: 0.26 kU/L — AB
ASPERGILLUS FUMIGATUS M3: 0.12 kU/L — AB
Allergen, Cottonwood, t14: <0.1 kU/L
Allergen, Oak,t7: <0.1 kU/L
Bermuda Grass: <0.1 kU/L
Box Elder IgE: <0.1 kU/L
CLASS: 0
CLASS: 0
CLASS: 0
CLASS: 0
CLASS: 0
CLASS: 0
CLASS: 0
CLASS: 0
CLASS: 0
CLASS: 0
CLASS: 0
Class: 0
Class: 0
Class: 0
Class: 0
Class: 0
Class: 0
Class: 0
Class: 0
Class: 0
Class: 0
Class: 0
Class: 0
Class: 0
Cockroach: <0.1 kU/L
D. FARINAE: 0.14 kU/L — AB
Dog Dander: <0.1 kU/L
Elm IgE: <0.1 kU/L
IgE (Immunoglobulin E), Serum: 211 kU/L — ABNORMAL HIGH (ref <=114)
Johnson Grass: <0.1 kU/L
Rough Pigweed  IgE: <0.1 kU/L
Sheep Sorrel IgE: <0.1 kU/L
Timothy Grass: <0.1 kU/L

## 2018-09-09 LAB — INTERPRETATION:

## 2018-09-12 ENCOUNTER — Telehealth: Payer: Self-pay | Admitting: Family Medicine

## 2018-09-12 NOTE — Telephone Encounter (Signed)
Copied from [facility] [id number]. Topic: Quick Communication - See Telephone Encounter >> Sep 12, 2018  1:54 PM Selinda Flavin B, NT wrote: CRM for notification. See Telephone encounter for: 09/12/18. Patient calling and would like to know if Dr Pamella Pert could prescribe something for anxiety/claustrophobia for her CT scan tomorrow? Please advise.    [id number] - [place], [place] - [address]

## 2018-09-12 NOTE — Progress Notes (Signed)
Spoke with pt and notified of results per Dr. Wert. Pt verbalized understanding and denied any questions. 

## 2018-09-13 ENCOUNTER — Ambulatory Visit (HOSPITAL_BASED_OUTPATIENT_CLINIC_OR_DEPARTMENT_OTHER)
Admission: RE | Admit: 2018-09-13 | Discharge: 2018-09-13 | Disposition: A | Discharge status: Home / Self Care | Payer: BLUE CROSS/BLUE SHIELD | Source: Ambulatory Visit | Attending: Internal Medicine | Admitting: Internal Medicine

## 2018-09-13 DIAGNOSIS — J45991 Cough variant asthma: Secondary | ICD-10-CM | POA: Insufficient documentation

## 2018-09-13 DIAGNOSIS — R059 Cough, unspecified: Secondary | ICD-10-CM

## 2018-09-13 DIAGNOSIS — G43909 Migraine, unspecified, not intractable, without status migrainosus: Secondary | ICD-10-CM | POA: Diagnosis not present

## 2018-09-13 DIAGNOSIS — R05 Cough: Secondary | ICD-10-CM | POA: Diagnosis not present

## 2018-09-14 ENCOUNTER — Telehealth: Payer: Self-pay | Admitting: Internal Medicine

## 2018-09-14 NOTE — Telephone Encounter (Signed)
Please advise 

## 2018-09-14 NOTE — Progress Notes (Signed)
LMTCB

## 2018-09-14 NOTE — Telephone Encounter (Signed)
Ct scan done already

## 2018-09-14 NOTE — Telephone Encounter (Signed)
Notes recorded by Tanda Rockers, MD on 09/14/2018 at 4:53 AM EST Call patient : Study is unremarkable, no change in recs --------------------------------------- [facility] x1 for pt.

## 2018-09-14 NOTE — Telephone Encounter (Signed)
Pt is calling back [phone]

## 2018-09-14 NOTE — Telephone Encounter (Signed)
ATC patient.  Left message to call back. 

## 2018-09-15 NOTE — Telephone Encounter (Signed)
Called and spoke with patient she is aware and verbalized understanding.

## 2018-10-26 ENCOUNTER — Ambulatory Visit: Payer: BLUE CROSS/BLUE SHIELD | Admitting: Internal Medicine

## 2018-11-23 ENCOUNTER — Ambulatory Visit: Payer: BLUE CROSS/BLUE SHIELD | Admitting: Internal Medicine

## 2018-11-24 ENCOUNTER — Other Ambulatory Visit: Payer: Self-pay | Admitting: Internal Medicine

## 2018-11-24 MED ORDER — RABEPRAZOLE SODIUM 20 MG PO TBEC: DELAYED_RELEASE_TABLET | ORAL | 0 refills | Status: DC

## 2018-12-07 ENCOUNTER — Other Ambulatory Visit: Payer: Self-pay | Admitting: Family Medicine

## 2018-12-07 DIAGNOSIS — R062 Wheezing: Secondary | ICD-10-CM

## 2018-12-07 DIAGNOSIS — R05 Cough: Secondary | ICD-10-CM

## 2018-12-07 DIAGNOSIS — R0989 Other specified symptoms and signs involving the circulatory and respiratory systems: Secondary | ICD-10-CM

## 2018-12-07 DIAGNOSIS — R059 Cough, unspecified: Secondary | ICD-10-CM

## 2018-12-07 DIAGNOSIS — J4541 Moderate persistent asthma with (acute) exacerbation: Secondary | ICD-10-CM

## 2018-12-07 NOTE — Telephone Encounter (Signed)
Requested Prescriptions  Pending Prescriptions Disp Refills  . PROAIR HFA 108 (90 Base) MCG/ACT inhaler [Pharmacy Med Name: PROAIR HFA ORAL INH (200  PFS) 8.5G] 8.5 g 1    Sig: INHALE 2 PUFFS INTO THE LUNGS EVERY 4 HOURS AS NEEDED FOR WHEEZING OR SHORTNESS OF BREATH OR COUGH     Pulmonology:  Beta Agonists Failed - 12/07/2018  1:14 PM      Failed - One inhaler should last at least one month. If the patient is requesting refills earlier, contact the patient to check for uncontrolled symptoms.      Passed - Valid encounter within last 12 months    Recent Outpatient Visits          3 months ago Moderate persistent asthmatic bronchitis with acute exacerbation   [facility], Lilia Argue, MD   4 months ago Moderate persistent asthmatic bronchitis with acute exacerbation   [facility], Lilia Argue, MD   5 months ago Wheezing   [facility] at [facility], Fenton Malling, MD   2 years ago Influenza B   [facility] at [place], [place]   2 years ago Cough   [facility] at [facility], [place], [place]

## 2018-12-28 ENCOUNTER — Other Ambulatory Visit: Payer: Self-pay | Admitting: Family Medicine

## 2018-12-29 ENCOUNTER — Other Ambulatory Visit: Payer: Self-pay | Admitting: Internal Medicine

## 2019-01-25 ENCOUNTER — Ambulatory Visit: Payer: BLUE CROSS/BLUE SHIELD | Admitting: Internal Medicine

## 2019-02-13 ENCOUNTER — Other Ambulatory Visit: Payer: Self-pay | Admitting: Internal Medicine

## 2019-02-23 ENCOUNTER — Other Ambulatory Visit: Payer: Self-pay | Admitting: *Deleted

## 2019-02-26 ENCOUNTER — Encounter: Payer: Self-pay | Admitting: Family Medicine

## 2019-02-28 MED ORDER — RABEPRAZOLE SODIUM 20 MG PO TBEC: DELAYED_RELEASE_TABLET | ORAL | 0 refills | Status: DC

## 2019-03-13 ENCOUNTER — Ambulatory Visit
Admission: RE | Admit: 2019-03-13 | Discharge: 2019-03-13 | Disposition: A | Discharge status: Home / Self Care | Payer: BLUE CROSS/BLUE SHIELD | Source: Ambulatory Visit

## 2019-03-13 DIAGNOSIS — J060 Acute laryngopharyngitis: Secondary | ICD-10-CM

## 2019-03-13 MED ORDER — AMOXICILLIN 500 MG PO CAPS: 500.0000 mg | ORAL_CAPSULE | Freq: Two times a day (BID) | ORAL | 0 refills | Status: AC

## 2019-03-13 NOTE — Discharge Instructions (Signed)
Sore Throat  May pick up amoxicillin twice daily for 10 days.   Please continue Tylenol or Ibuprofen for fever and pain. May try salt water gargles, cepacol lozenges, throat spray, or OTC cold relief medicine for throat discomfort. If you also have congestion take a daily anti-histamine like Zyrtec, Claritin, and a oral decongestant to help with post nasal drip that may be irritating your throat.   Stay hydrated and drink plenty of fluids to keep your throat coated relieve irritation.

## 2019-04-04 ENCOUNTER — Ambulatory Visit: Payer: BC Managed Care – PPO | Admitting: Internal Medicine

## 2019-04-04 ENCOUNTER — Other Ambulatory Visit: Payer: Self-pay

## 2019-04-04 ENCOUNTER — Encounter: Payer: Self-pay | Admitting: Gastroenterology

## 2019-04-04 ENCOUNTER — Encounter: Payer: Self-pay | Admitting: Internal Medicine

## 2019-04-04 DIAGNOSIS — J45991 Cough variant asthma: Secondary | ICD-10-CM

## 2019-04-04 DIAGNOSIS — Z23 Encounter for immunization: Secondary | ICD-10-CM

## 2019-04-04 MED ORDER — BUDESONIDE-FORMOTEROL FUMARATE 80-4.5 MCG/ACT IN AERO: 2.0000 | INHALATION_SPRAY | Freq: Two times a day (BID) | RESPIRATORY_TRACT | 0 refills | Status: DC

## 2019-04-04 MED ORDER — RABEPRAZOLE SODIUM 20 MG PO TBEC: DELAYED_RELEASE_TABLET | ORAL | 2 refills | Status: DC

## 2019-04-04 NOTE — Progress Notes (Signed)
Sheena Young, female    DOB: October 15, 1975, 43 y.o.   MRN: [id number]   Brief patient profile:  42 yowf quit smoking 2016 with onset of doe / cough worse in winter rx zyrtec nightly / albuterol prn better in summer and never refilled then much worse p ? Sheena Young Thanksgiving 2019  with nasal/ear congestion and worse doe/cough prod thick yellow rx zpak and doxy and no better / prednisone improved then relapsed so referred to pulmonary clinic 09/08/2018 by Dr   Sheena Young     History of Present Illness  09/08/2018  Pulmonary/ 1st office eval/Sheena Young  Chief Complaint  Patient presents with  . Pulmonary Consult    referred by Dr. Pamella Young (PCP) for asthmatic bronchitis; on Flovent two puffs BID; reports only uses albuterol ONCE since prescribed Flovent (1 mo ago)  Dyspnea:  Shopping ok grocery store / walking dog uphill Cough: some better on tussin otc / worse with activity Sleep: doing better on side / bed is flat SABA use:  Rarely needing now but helps cough and sob when uses as needed Cough persists greenish esp in am and cough to point of vomit x years  Sneezing is severe also on zyrtec maint rx  Has "gastris" x years with IBS intol to most ppi's but has not tried aciphex or pepcid otc  Using lots of tums and mints  rec Stop flovent  Plan A = Automatic = Symbicort 80 Take 2 puffs first thing in am and then another 2 puffs about 12 hours later.  Work on inhaler technique:  Plan B = Backup Only use your albuterol (PROAIR) inhaler as a rescue medication  Aciphex 20 mg   Take  30-60 min before first meal of the day and Pepcid (famotidine)  20 mg one hour before bedtime until return to office - this is the best way to tell whether stomach acid is contributing to your problem.   GERD           04/04/2019  f/u ov/Sheena Young re:  prob gerd much better since on aciphex in all regards   ? How much asthma as no longer taking any symb s flare as long as takes gerd rx   Chief Complaint  Patient presents  with  . Follow-up    Here for refill on aciphex. Cough is under control.   Dyspnea:  Not limited by breathing from desired activities  / walking dog ok Cough: none Sleeping: on side  SABA use: none  02: none    No obvious day to day or daytime variability or assoc excess/ purulent sputum or mucus plugs or hemoptysis or cp or chest tightness, subjective wheeze or overt sinus or hb symptoms.   Sleeping as above  without nocturnal  or early am exacerbation  of respiratory  c/o's or need for noct saba. Also denies any obvious fluctuation of symptoms with weather or environmental changes or other aggravating or alleviating factors except as outlined above   No unusual exposure hx or h/o childhood pna/ asthma or knowledge of premature birth.  Current Allergies, Complete Past Medical History, Past Surgical History, Family History, and Social History were reviewed in [employer] record.  ROS  The following are not active complaints unless bolded Hoarseness, sore throat, dysphagia, dental problems, itching, sneezing,  nasal congestion or discharge of excess mucus or purulent secretions, ear ache,   fever, chills, sweats, unintended wt loss or wt gain, classically pleuritic or exertional cp,  orthopnea pnd  or arm/hand swelling  or leg swelling, presyncope, palpitations, abdominal pain, anorexia, nausea, vomiting, diarrhea  or change in bowel habits or change in bladder habits, change in stools or change in urine, dysuria, hematuria,  rash, arthralgias, visual complaints, headache, numbness, weakness or ataxia or problems with walking or coordination,  change in mood or  memory.        Current Meds  Medication Sig  . budesonide-formoterol (SYMBICORT) 80-4.5 MCG/ACT inhaler Inhale 2 puffs into the lungs 2 (two) times daily.  . cetirizine (ZYRTEC) 10 MG tablet TAKE 1 TABLET(10 MG) BY MOUTH DAILY  . famotidine (PEPCID) 20 MG tablet One at bedtime  . montelukast (SINGULAIR) 10 MG  tablet TAKE 1 TABLET(10 MG) BY MOUTH AT BEDTIME  . PROAIR HFA 108 (90 Base) MCG/ACT inhaler INHALE 2 PUFFS INTO THE LUNGS EVERY 4 HOURS AS NEEDED FOR WHEEZING OR SHORTNESS OF BREATH OR COUGH  . Probiotic Product (PROBIOTIC DAILY PO) Take 1 tablet by mouth daily. Reported on 07/12/2015  . RABEprazole (ACIPHEX) 20 MG tablet Take 30-60 min before first meal of the day                   Past Medical History:  Diagnosis Date  . Anxiety    but doesn't take any meds for this- ? panic attacks- not diagnosised  . Chronic back pain   . Gastritis    Chronic   . Gastritis, acute   . GERD (gastroesophageal reflux disease)    takes Zantac prn  . Headache(784.0)    occaionally  . History of kidney stones   . History of migraine    last one about 28months  . Insomnia   . Joint pain   . Joint swelling   . Overactive bladder    never filled prescription bc unable to afford  . Shortness of breath dyspnea    with anxiety  . Urinary frequency   . Urinary urgency   . Vomiting        Objective:     Wt Readings from Last 3 Encounters:  04/04/19 194 lb (88 kg)  09/08/18 193 lb (87.5 kg)  08/22/18 194 lb (88 kg)     Vital signs reviewed - Note on arrival 02 sats  99% on RA  amb wf nad      HEENT : pt wearing mask not removed for exam due to covid -19 concerns.   NECK :  without JVD/Nodes/TM/ nl carotid upstrokes bilaterally   LUNGS: no acc muscle use,  Nl contour chest which is clear to A and P bilaterally without cough on insp or exp maneuvers   CV:  RRR  no s3 or murmur or increase in P2, and no edema   ABD:  soft and nontender with nl inspiratory excursion in the supine position. No bruits or organomegaly appreciated, bowel sounds nl  MS:  Nl gait/ ext warm without deformities, calf tenderness, cyanosis or clubbing No obvious joint restrictions   SKIN: warm and dry without lesions    NEURO:  alert, approp, nl sensorium with  no motor or cerebellar deficits apparent.                Assessment

## 2019-04-04 NOTE — Assessment & Plan Note (Signed)
Onset 2016 when quit smoking  - Spirometry 09/08/2018  FEV1 2.7 (89%)  Ratio 0.91 s curvature - 09/08/2018    symb 80 2bid - Allergy profile 09/08/2018 >  Eos 0.1/  IgE 211 RAST pos dust and mold - Sinus CT  09/13/2018 > neg -  04/04/2019  After extensive coaching inhaler device,  effectiveness =    90%   Not sure this is really asthma vs uacs as all her symptoms resolved p rx with aciphex so rec:  >>>Continue singulair for now >>>Use symb 80 2bid prn Based on two studies from NEJM  378; 20 p 1865 (2018) and 380 : p2020-30 (2019) in pts with mild asthma it is reasonable to use low dose symbicort eg 80 2bid "prn" flare in this setting but I emphasized this was only shown with symbicort and takes advantage of the rapid onset of action but is not the same as "rescue therapy" but can be stopped once the acute symptoms have resolved and the need for rescue has been minimized (< 2 x weekly)    >>> continue aciphex /pepcid pending GI eval for longterm refills or other options given age of only 30 might be a candidate for example for NF.   I had an extended discussion with the patient reviewing all relevant studies completed to date and  lasting 15 to 20 minutes of a 25 minute visit    I performed detailed device teaching using a teach back method which extended face to face time for this visit (see above)  Each maintenance medication was reviewed in detail including emphasizing most importantly the difference between maintenance and prns and under what circumstances the prns are to be triggered using an action plan format that is not reflected in the computer generated alphabetically organized AVS which I have not found useful in most complex patients, especially with respiratory illnesses  Please see AVS for specific instructions unique to this visit that I personally wrote and verbalized to the the pt in detail and then reviewed with pt  by my nurse highlighting any  changes in therapy recommended at  today's visit to their plan of care.

## 2019-04-04 NOTE — Patient Instructions (Signed)
Keep the symbicort 80 on hand to use up to 2 puffs every 12 hours if needed for at least a week   We will be referring you to [facility] re reflux and gas pains  Please schedule a follow up visit in 3 months but call sooner if needed

## 2019-04-10 DIAGNOSIS — H02052 Trichiasis without entropian right lower eyelid: Secondary | ICD-10-CM | POA: Diagnosis not present

## 2019-05-01 ENCOUNTER — Other Ambulatory Visit: Payer: Self-pay | Admitting: Family Medicine

## 2019-05-01 ENCOUNTER — Encounter: Payer: Self-pay | Admitting: Family Medicine

## 2019-05-01 DIAGNOSIS — R509 Fever, unspecified: Secondary | ICD-10-CM

## 2019-05-01 NOTE — Progress Notes (Signed)
Spoke to her husband today- he is not feeling well, sore throat.  He reports that Javae may have had a low grade fever.  I am ordering covid testing for both of them

## 2019-05-02 ENCOUNTER — Other Ambulatory Visit: Payer: Self-pay

## 2019-05-02 DIAGNOSIS — Z20822 Contact with and (suspected) exposure to covid-19: Secondary | ICD-10-CM

## 2019-05-02 DIAGNOSIS — Z20828 Contact with and (suspected) exposure to other viral communicable diseases: Secondary | ICD-10-CM | POA: Diagnosis not present

## 2019-05-03 ENCOUNTER — Encounter: Payer: Self-pay | Admitting: Family Medicine

## 2019-05-03 LAB — NOVEL CORONAVIRUS, NAA: SARS-CoV-2, NAA: NOT DETECTED

## 2019-05-04 ENCOUNTER — Encounter: Payer: Self-pay | Admitting: Family Medicine

## 2019-05-04 ENCOUNTER — Other Ambulatory Visit: Payer: Self-pay

## 2019-05-04 ENCOUNTER — Encounter (HOSPITAL_COMMUNITY): Payer: Self-pay

## 2019-05-04 ENCOUNTER — Emergency Department (HOSPITAL_COMMUNITY)
Admission: EM | Admit: 2019-05-04 | Discharge: 2019-05-04 | Disposition: A | Discharge status: Home / Self Care | Payer: BC Managed Care – PPO | Attending: Emergency Medicine | Admitting: Emergency Medicine

## 2019-05-04 ENCOUNTER — Ambulatory Visit (HOSPITAL_COMMUNITY)
Admission: EM | Admit: 2019-05-04 | Discharge: 2019-05-04 | Disposition: A | Discharge status: Home / Self Care | Payer: BC Managed Care – PPO | Source: Home / Self Care

## 2019-05-04 ENCOUNTER — Emergency Department (HOSPITAL_COMMUNITY): Payer: BC Managed Care – PPO

## 2019-05-04 ENCOUNTER — Telehealth: Payer: BC Managed Care – PPO

## 2019-05-04 DIAGNOSIS — F1210 Cannabis abuse, uncomplicated: Secondary | ICD-10-CM | POA: Diagnosis not present

## 2019-05-04 DIAGNOSIS — R55 Syncope and collapse: Secondary | ICD-10-CM

## 2019-05-04 DIAGNOSIS — R071 Chest pain on breathing: Secondary | ICD-10-CM | POA: Diagnosis not present

## 2019-05-04 DIAGNOSIS — M545 Low back pain: Secondary | ICD-10-CM | POA: Insufficient documentation

## 2019-05-04 DIAGNOSIS — E860 Dehydration: Secondary | ICD-10-CM

## 2019-05-04 DIAGNOSIS — Z87891 Personal history of nicotine dependence: Secondary | ICD-10-CM | POA: Diagnosis not present

## 2019-05-04 DIAGNOSIS — R0602 Shortness of breath: Secondary | ICD-10-CM | POA: Diagnosis not present

## 2019-05-04 DIAGNOSIS — R079 Chest pain, unspecified: Secondary | ICD-10-CM | POA: Diagnosis not present

## 2019-05-04 DIAGNOSIS — R42 Dizziness and giddiness: Secondary | ICD-10-CM | POA: Diagnosis not present

## 2019-05-04 DIAGNOSIS — Z79899 Other long term (current) drug therapy: Secondary | ICD-10-CM | POA: Diagnosis not present

## 2019-05-04 LAB — I-STAT BETA HCG BLOOD, ED (MC, WL, AP ONLY): I-stat hCG, quantitative: <5 m[IU]/mL (ref <5)

## 2019-05-04 LAB — CBC
HCT: 39.5 % (ref 36.0–46.0)
Hemoglobin: 13.2 g/dL (ref 12.0–15.0)
MCH: 32 pg (ref 26.0–34.0)
MCHC: 33.4 g/dL (ref 30.0–36.0)
MCV: 95.9 fL (ref 80.0–100.0)
Platelets: 331 10*3/uL (ref 150–400)
RBC: 4.12 MIL/uL (ref 3.87–5.11)
RDW: 13.2 % (ref 11.5–15.5)
WBC: 10.1 10*3/uL (ref 4.0–10.5)
nRBC: 0 % (ref 0.0–0.2)

## 2019-05-04 LAB — URINALYSIS, ROUTINE W REFLEX MICROSCOPIC
Bilirubin Urine: NEGATIVE
Glucose, UA: NEGATIVE mg/dL
Hgb urine dipstick: NEGATIVE
Ketones, ur: 20 mg/dL — AB
Leukocytes,Ua: NEGATIVE
Nitrite: NEGATIVE
Protein, ur: NEGATIVE mg/dL
Specific Gravity, Urine: 1.019 (ref 1.005–1.030)
pH: 7 (ref 5.0–8.0)

## 2019-05-04 LAB — CBG MONITORING, ED: Glucose-Capillary: 88 mg/dL (ref 70–99)

## 2019-05-04 LAB — BASIC METABOLIC PANEL
Anion gap: 9 (ref 5–15)
BUN: 8 mg/dL (ref 6–20)
CO2: 26 mmol/L (ref 22–32)
Calcium: 9.1 mg/dL (ref 8.9–10.3)
Chloride: 105 mmol/L (ref 98–111)
Creatinine, Ser: 0.81 mg/dL (ref 0.44–1.00)
GFR calc Af Amer: >60 mL/min (ref >60)
GFR calc non Af Amer: >60 mL/min (ref >60)
Glucose, Bld: 99 mg/dL (ref 70–99)
Potassium: 3.6 mmol/L (ref 3.5–5.1)
Sodium: 140 mmol/L (ref 135–145)

## 2019-05-04 LAB — D-DIMER, QUANTITATIVE: D-Dimer, Quant: <0.27 ug/mL-FEU (ref 0.00–0.50)

## 2019-05-04 LAB — TROPONIN I (HIGH SENSITIVITY): Troponin I (High Sensitivity): 3 ng/L (ref <18)

## 2019-05-04 MED ORDER — SODIUM CHLORIDE 0.9% FLUSH: 3.0000 mL | Freq: Once | INTRAVENOUS | Status: DC

## 2019-05-04 NOTE — ED Notes (Signed)
Delay explained

## 2019-05-04 NOTE — ED Triage Notes (Signed)
Pt here from urgent care, went there today for syncopal episode yesterday. Found by sister in bathroom after unknown downtime. Pt sts prior to fall she had sharp pain in left lower back and that is the last thing she remembers. Pt A/o x 4 now, still endorses intermittent dizziness. Pt endorses neck pain, which is chronic.

## 2019-05-04 NOTE — Discharge Instructions (Signed)
Your laboratory results were within normal limits today.  Your chest x-ray was negative.  Please follow-up with your primary care physician as needed.

## 2019-05-04 NOTE — ED Provider Notes (Signed)
[facility] EMERGENCY DEPARTMENT Provider Note   CSN: [id number] Arrival date & time: 05/04/19  1221     History   Chief Complaint Chief Complaint  Patient presents with   Loss of Consciousness    HPI Sheena Young is a 43 y.o. female.     43 y/o female with a PMH of GERD, Anxiety, Overactive bladder presents to the ED with chief complaint of LOC. Patient reports she at work yesterday when she suddenly felt a sharp pain to her lower back pain and then synopsized.  Patient reports she lost consciousness and woke up ultimately on the right side of her face which was then swelling.  She reports she was diagnosed with strep 3 weeks ago, finished her antibiotic course and felt better however yesterday she felt worse than the syncope happened.  She reports she always feels lightheaded at baseline, also has shortness of breath at baseline, she does have a history of asthma and continues to use her inhaler on a daily basis.  Patient also reports angling and paresthesias to bilateral upper and lower extremities, she does have prior history of neuropathy.  She does endorse some chest discomfort, this is worse with deep inspiration.  Patient denies any fevers, prior history of CAD, prior history of blood clots, weakness, tingling.  The history is provided by the patient.  Loss of Consciousness Associated symptoms: shortness of breath   Associated symptoms: no chest pain, no fever, no palpitations and no vomiting     Past Medical History:  Diagnosis Date   Anxiety    but doesn't take any meds for this- ? panic attacks- not diagnosised   Chronic back pain    Gastritis    Chronic    Gastritis, acute    GERD (gastroesophageal reflux disease)    takes Zantac prn   Headache(784.0)    occaionally   History of kidney stones    History of migraine    last one about 21months   Insomnia    Joint pain    Joint swelling    Overactive bladder    never filled  prescription bc unable to afford   Shortness of breath dyspnea    with anxiety   Urinary frequency    Urinary urgency    Vomiting     Patient Active Problem List   Diagnosis Date Noted   Cough variant asthma  vs UACS 09/08/2018   Radiculopathy 03/13/2015   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      Home Medications    Prior to Admission medications   Medication Sig Start Date End Date Taking? Authorizing Provider  budesonide-formoterol (SYMBICORT) 80-4.5 MCG/ACT inhaler Inhale 2 puffs into the lungs 2 (two) times daily. 09/08/18   Tanda Rockers, MD  cetirizine (ZYRTEC) 10 MG tablet TAKE 1 TABLET(10 MG) BY MOUTH DAILY 08/22/18   Rutherford Guys, MD  famotidine (PEPCID) 20 MG tablet One  at bedtime 09/08/18   Tanda Rockers, MD  montelukast (SINGULAIR) 10 MG tablet TAKE 1 TABLET(10 MG) BY MOUTH AT BEDTIME 12/28/18   Rutherford Guys, MD  [facility] HFA 108 [phone] Base) MCG/ACT inhaler INHALE 2 PUFFS INTO THE LUNGS EVERY 4 HOURS AS NEEDED FOR WHEEZING OR SHORTNESS OF BREATH OR COUGH 12/07/18   Rutherford Guys, MD  Probiotic Product (PROBIOTIC DAILY PO) Take 1 tablet by mouth daily. Reported on 07/12/2015    [provider]  RABEprazole (ACIPHEX) 20 MG tablet Take 30-60 min before first meal of the day 04/04/19   Tanda Rockers, MD    Family History Family History  Problem Relation Age of Onset   Cervical  cancer Sister    Diabetes Maternal Grandmother    Congestive Heart Failure Maternal Grandmother    Diabetes Maternal Aunt        x 3   Diabetes Maternal Uncle        x 3   Heart attack Paternal Grandfather    Heart disease Maternal Uncle    Colon polyps Neg Hx    Colon cancer Neg Hx    Liver disease Neg Hx    Kidney disease Neg Hx     Social History Social History   Tobacco Use   Smoking status: Former Smoker    Packs/day: 0.25    Years: 8.00    Pack years: 2.00    Types: E-cigarettes, Cigarettes    Quit date: 03/11/2015    Years since quitting: 4.1   Smokeless tobacco: Never Used   Tobacco comment: of electronic cigarette  Substance Use Topics   Alcohol use: Yes    Alcohol/week: 0.0 - 2.0 standard drinks    Comment: reports once every 10 days as of 09/08/2018   Drug use: Yes    Frequency: 7.0 times per week    Types: Marijuana    Comment: self-treats anxiety     Allergies   Patient has no known allergies.   Review of Systems Review of Systems  Constitutional: Negative for chills and fever.  HENT: Negative for rhinorrhea.   Respiratory: Positive for cough and shortness of breath.   Cardiovascular: Positive for syncope. Negative for chest pain, palpitations and leg swelling.  Gastrointestinal: Negative for abdominal pain, diarrhea and vomiting.  Genitourinary: Negative for flank pain and pelvic pain.  Musculoskeletal: Negative for back pain.  Neurological: Positive for light-headedness.     Physical Exam Updated Vital Signs BP 117/82    Pulse 86    Temp 98.7 F (37.1 C)    Resp 19    LMP 04/15/2019    SpO2 100%   Physical Exam Vitals signs and nursing note reviewed.  Constitutional:      Appearance: Normal appearance.  HENT:     Head: Normocephalic and atraumatic.     Nose: Nose normal.     Mouth/Throat:     Mouth: Mucous membranes are moist.  Eyes:     Pupils: Pupils are equal, round, and reactive to light.  Cardiovascular:      Rate and Rhythm: Normal rate.  Pulmonary:     Effort: Pulmonary effort is normal.     Breath sounds: No wheezing or rhonchi.     Comments: Mild diminished breath sounds.  Abdominal:     General: Abdomen is flat.  Skin:    General: Skin is warm and dry.  Neurological:     Mental Status: She is alert and oriented to person, place, and  time.     Comments: Alert, oriented, thought content appropriate. Speech fluent without evidence of aphasia. Able to follow 2 step commands without difficulty.  Cranial Nerves:  II:  Peripheral visual fields grossly normal, pupils, round, reactive to light III,IV, VI: ptosis not present, extra-ocular motions intact bilaterally  V,VII: smile symmetric, facial light touch sensation equal VIII: hearing grossly normal bilaterally  IX,X: midline uvula rise  XI: bilateral shoulder shrug equal and strong XII: midline tongue extension  Motor:  5/5 in upper and lower extremities bilaterally including strong and equal grip strength and dorsiflexion/plantar flexion Sensory: light touch normal in all extremities.  Cerebellar: normal finger-to-nose with bilateral upper extremities, pronator drift negative Gait: normal gait and balance      ED Treatments / Results  Labs (all labs ordered are listed, but only abnormal results are displayed) Labs Reviewed  URINALYSIS, ROUTINE W REFLEX MICROSCOPIC - Abnormal; Notable for the following components:      Result Value   APPearance HAZY (*)    Ketones, ur 20 (*)    All other components within normal limits  BASIC METABOLIC PANEL  CBC  D-DIMER, QUANTITATIVE (NOT AT [facility])  CBG MONITORING, ED  I-STAT BETA HCG BLOOD, ED ([facility], [facility], [facility] ONLY)  TROPONIN I (HIGH SENSITIVITY)  TROPONIN I (HIGH SENSITIVITY)    EKG EKG Interpretation  Date/Time:  Thursday May 04 2019 12:41:59 EDT Ventricular Rate:  91 PR Interval:  152 QRS Duration: 74 QT Interval:  344 QTC Calculation: 423 R Axis:   65 Text Interpretation:   Normal sinus rhythm Normal ECG Confirmed by Madalyn Rob [phone]) on 05/04/2019 2:28:17 PM   Radiology Dg Chest Portable 1 View  Result Date: 05/04/2019 CLINICAL DATA:  Shortness of breath EXAM: PORTABLE CHEST 1 VIEW COMPARISON:  06/12/2016, 07/26/2018 FINDINGS: The heart size and mediastinal contours are within normal limits. Both lungs are clear. The visualized skeletal structures are unremarkable. IMPRESSION: No active disease. Electronically Signed   By: Donavan Foil M.D.   On: 05/04/2019 21:12    Procedures Procedures (including critical care time)  Medications Ordered in ED Medications  sodium chloride flush (NS) 0.9 % injection 3 mL (has no administration in time range)     Initial Impression / Assessment and Plan / ED Course  I have reviewed the triage vital signs and the nursing notes.  Pertinent labs & imaging results that were available during my care of the patient were reviewed by me and considered in my medical decision making (see chart for details).   Patient here with a complaint of loss of consciousness yesterday after syncopized on the floor after getting a sharp sensation to her back, this of a prior history of chronic back pain.  She arrived in the ED with vitals within normal limits, reports has been running a fever with a T-max of 102, although afebrile in the ED.  She also endorses shortness of breath which she does have at baseline, however has noted to be using her inhaler more often recently.  Patient does report she used to have syncopal episodes since the age of 89, has not had one in several years but these have been common for her.  She also reports she always feels short of breath however her inhaler has been helping her for the last couple of months.  She also endorses a left lower back pain which she does suffer from after she had several back surgeries.  Patient was seen ambulatory in the ED, with a steady  gait no dizziness while  ambulating.  Suspicion for some emboli although she is currently not on any estrogen denies any leg swelling.  D-dimer was negative.  BMP unremarkable without any electrolyte derangement.  CBC with any signs of infection, IMA globin is unremarkable.  UA does show some ketones, some suspicion for dehydration.  A troponin was also obtained which was negative.  CBG without abnormalities.  An x-ray of her chest was obtained to further evaluate any consolidation, chest x-ray showed no acute consolidation, pneumothorax or pleural effusion. Patient does have some ketones in her urine, some suspicion that this is due to dehydration and likely causing her symptoms, will obtain orthostatic vital signs.  I have discussed providing fluids for patient however patient has been in the ED for 10 hours, will not like to stay for further work-up.  Upon reviewing results with patient, she also endorses nausea and had episodes of vomiting, she does have a longstanding history of gastritis, last had an EGD and colonoscopy 15 years ago, she does have an schedule appointment with GI physician on Tuesday.  Patient is otherwise within normal limits, non toxic appearing. Stable for discharge.    Portions of this note were generated with Lobbyist. Dictation errors may occur despite best attempts at proofreading.  Final Clinical Impressions(s) / ED Diagnoses   Final diagnoses:  Syncope and collapse  Dehydration    ED Discharge Orders    None       Janeece Fitting, PA-C 05/04/19 2248    Varney Biles, MD 05/05/19 [phone]

## 2019-05-04 NOTE — ED Triage Notes (Signed)
Patient presents to Urgent Care with complaints of feeling sick "like I have COVID" since the past few days. Patient reports she had a sore throat so she took some of her husband's leftover antibiotics (pt educated on proper antibiotic use) and it seemed to help her throat. Pt endorses LOC yesterday while in the bathroom, unsure how long she was passed out, sister eventually found her. Pt endorses occasional dizziness with position changes, takes several medications for BP and gastritis.  Patient is being discharged from the [facility] and sent to the Emergency Department via wheelchair by staff. Per Dr. Lanny Cramp, patient is stable but in need of higher level of care due to dizziness and LOC. Patient is aware and verbalizes understanding of plan of care.  Vitals:   05/04/19 1210  BP: 137/84  Pulse: (!) 105  Resp: 18  Temp: 98.6 F (37 C)  SpO2: 96%

## 2019-05-04 NOTE — ED Notes (Signed)
This tech went to the lobby to get patient to be roomed to EDP. Patient walked to this tech, offered wheelchair, PT declined. PT states "I am making a formal complaint about that nurse out. She just made me run all the way to my car for my nueropathy medication. Patient stated that nurse said "I needed to hurry, and run because if they call my name and I miss my spot they move on, and I will have to start over". Pt also states she had to use her albuterol 3 times since running to the urgent care and back. This tech listened, and apologized for the situation to the patient attempted to comfort and make the situation better. This tech made RN, and charge nurse aware.

## 2019-05-08 ENCOUNTER — Ambulatory Visit: Payer: BC Managed Care – PPO | Admitting: Family Medicine

## 2019-05-08 ENCOUNTER — Other Ambulatory Visit: Payer: Self-pay

## 2019-05-08 ENCOUNTER — Ambulatory Visit (INDEPENDENT_AMBULATORY_CARE_PROVIDER_SITE_OTHER): Payer: BC Managed Care – PPO

## 2019-05-08 ENCOUNTER — Encounter: Payer: Self-pay | Admitting: Family Medicine

## 2019-05-08 VITALS — BP 122/80 | HR 79 | Temp 99.2°F | Ht 64.0 in | Wt 192.4 lb

## 2019-05-08 DIAGNOSIS — J302 Other seasonal allergic rhinitis: Secondary | ICD-10-CM | POA: Diagnosis not present

## 2019-05-08 DIAGNOSIS — R112 Nausea with vomiting, unspecified: Secondary | ICD-10-CM | POA: Diagnosis not present

## 2019-05-08 DIAGNOSIS — R1110 Vomiting, unspecified: Secondary | ICD-10-CM | POA: Diagnosis not present

## 2019-05-08 DIAGNOSIS — J029 Acute pharyngitis, unspecified: Secondary | ICD-10-CM

## 2019-05-08 DIAGNOSIS — J4541 Moderate persistent asthma with (acute) exacerbation: Secondary | ICD-10-CM | POA: Diagnosis not present

## 2019-05-08 LAB — POCT RAPID STREP A (OFFICE): Rapid Strep A Screen: NEGATIVE

## 2019-05-08 MED ORDER — PREDNISONE 20 MG PO TABS: 40.0000 mg | ORAL_TABLET | Freq: Every day | ORAL | 0 refills | Status: DC

## 2019-05-08 MED ORDER — FLUTICASONE PROPIONATE 50 MCG/ACT NA SUSP: 1.0000 | Freq: Two times a day (BID) | NASAL | 6 refills | Status: DC

## 2019-05-08 NOTE — Patient Instructions (Signed)
° ° ° °  If you have lab work done today you will be contacted with your lab results within the next 2 weeks.  If you have not heard from us then please contact us. The fastest way to get your results is to register for My Chart. ° ° °IF you received an x-ray today, you will receive an invoice from [facility]. Please contact [facility] at [phone] with questions or concerns regarding your invoice.  ° °IF you received labwork today, you will receive an invoice from [facility]. Please contact [facility] at [phone] with questions or concerns regarding your invoice.  ° °Our billing staff will not be able to assist you with questions regarding bills from these companies. ° °You will be contacted with the lab results as soon as they are available. The fastest way to get your results is to activate your My Chart account. Instructions are located on the last page of this paperwork. If you have not heard from us regarding the results in 2 weeks, please contact this office. °  ° ° ° °

## 2019-05-08 NOTE — Progress Notes (Signed)
10/26/20201:51 PM  Sheena Young 05-09-1976, 43 y.o., female [id number]  Chief Complaint  Patient presents with  . Loss of Consciousness    ED on 22 of OCT   . Hospitalization Follow-up  . Fatigue    over a month     HPI:   Patient is a 43 y.o. female with past medical history significant for asthma who presents today for ER followup  Patient seen in the ER on Oct 22nd for syncope and collapse which happend at work after sudden sharp low back pain Which was thought to maybe be from dehydration as ketones present in urine, all other workup neg.   Patient has not been feeling well for past 3 weeks Feeling very tired, falling asleep easily Still having right sided sore throat and LAD, ear pain, feels like she is in a washing machine, nasal congestion Has been taking mucinex and sudafed She was exposed to strep 3 weeks ago Treated empirically with amoxicillin She continues to vomit, mostly in the morning, not food, mostly phelgm, having reflux, no worsening IBS  sees GI tomorrow Urinating ok, no blood in urine, no dysuria In a way this reminds her of previous kidney stones  She reports that she has been using her albuterol on most days for wheezing, chest tightness, , worse when she tries to be active or is outside Uses symbicort BID Sees sees pulm next month  Tested covid neg at ER  Depression screen [facility] 2/9 05/08/2019 08/22/2018 07/26/2018  Decreased Interest 0 0 0  Down, Depressed, Hopeless 0 0 0  PHQ - 2 Score 0 0 0  Altered sleeping - - 0  Tired, decreased energy - - 0  Change in appetite - - 0  Feeling bad or failure about yourself  - - 0  Trouble concentrating - - 0  Moving slowly or fidgety/restless - - 0  Suicidal thoughts - - 0  PHQ-9 Score - - 0  Difficult doing work/chores - - Not difficult at all    Fall Risk  05/08/2019 08/22/2018 07/26/2018 06/14/2018 09/18/2016  Falls in the past year? 1 0 1 1 No  Number falls in past yr: 1 0 0 1 -  Injury with  Fall? 1 0 0 1 -  Follow up Falls evaluation completed - - - -     No Known Allergies  Prior to Admission medications   Medication Sig Start Date End Date Taking? Authorizing Provider  budesonide-formoterol (SYMBICORT) 80-4.5 MCG/ACT inhaler Inhale 2 puffs into the lungs 2 (two) times daily. 09/08/18  Yes Tanda Rockers, MD  cetirizine (ZYRTEC) 10 MG tablet TAKE 1 TABLET(10 MG) BY MOUTH DAILY 08/22/18  Yes Rutherford Guys, MD  famotidine (PEPCID) 20 MG tablet One at bedtime 09/08/18  Yes Tanda Rockers, MD  montelukast (SINGULAIR) 10 MG tablet TAKE 1 TABLET(10 MG) BY MOUTH AT BEDTIME 12/28/18  Yes Rutherford Guys, MD  PROAIR HFA 108 [phone] Base) MCG/ACT inhaler INHALE 2 PUFFS INTO THE LUNGS EVERY 4 HOURS AS NEEDED FOR WHEEZING OR SHORTNESS OF BREATH OR COUGH 12/07/18  Yes Rutherford Guys, MD  Probiotic Product (PROBIOTIC DAILY PO) Take 1 tablet by mouth daily. Reported on 07/12/2015   Yes [provider]  RABEprazole (ACIPHEX) 20 MG tablet Take 30-60 min before first meal of the day 04/04/19  Yes Tanda Rockers, MD    Past Medical History:  Diagnosis Date  . Anxiety    but doesn't take any meds for this- ?  panic attacks- not diagnosised  . Chronic back pain   . Gastritis    Chronic   . Gastritis, acute   . GERD (gastroesophageal reflux disease)    takes Zantac prn  . Headache(784.0)    occaionally  . History of kidney stones   . History of migraine    last one about 29months  . Insomnia   . Joint pain   . Joint swelling   . Overactive bladder    never filled prescription bc unable to afford  . Shortness of breath dyspnea    with anxiety  . Urinary frequency   . Urinary urgency   . Vomiting     Past Surgical History:  Procedure Laterality Date  . COLONOSCOPY W/ POLYPECTOMY    . ECTOPIC PREGNANCY SURGERY    . LUMBAR LAMINECTOMY/DECOMPRESSION MICRODISCECTOMY Right 11/03/2012   Procedure: LUMBAR LAMINECTOMY/DECOMPRESSION MICRODISCECTOMY  Right sided lumbar 4-5  microdisectomy;  Surgeon: Sinclair Ship, MD;  Location: [facility];  Service: Orthopedics;  Laterality: Right;  Right sided lumbar 4-5 microdisectomy  . peridontal surgery    . revision decompression  03/2015   decompression and fusion at L4/5, [facility]    Social History   Tobacco Use  . Smoking status: Former Smoker    Packs/day: 0.25    Years: 8.00    Pack years: 2.00    Types: E-cigarettes, Cigarettes    Quit date: 03/11/2015    Years since quitting: 4.1  . Smokeless tobacco: Never Used  . Tobacco comment: of electronic cigarette  Substance Use Topics  . Alcohol use: Yes    Alcohol/week: 0.0 - 2.0 standard drinks    Comment: reports once every 10 days as of 09/08/2018    Family History  Problem Relation Age of Onset  . Cervical cancer Sister   . Diabetes Maternal Grandmother   . Congestive Heart Failure Maternal Grandmother   . Diabetes Maternal Aunt        x 3  . Diabetes Maternal Uncle        x 3  . Heart attack Paternal Grandfather   . Heart disease Maternal Uncle   . Colon polyps Neg Hx   . Colon cancer Neg Hx   . Liver disease Neg Hx   . Kidney disease Neg Hx     ROS Per hpi  OBJECTIVE:  Today's Vitals   05/08/19 1342  BP: 122/80  Pulse: 79  Temp: 99.2 F (37.3 C)  TempSrc: Oral  SpO2: 98%  Weight: 192 lb 6.4 oz (87.3 kg)  Height: 5\' 4"  (1.626 m)   Body mass index is 33.03 kg/m.   Physical Exam Vitals signs and nursing note reviewed.  Constitutional:      Appearance: She is well-developed.  HENT:     Head: Normocephalic and atraumatic.     Right Ear: Hearing, tympanic membrane, ear canal and external ear normal.     Left Ear: Hearing, tympanic membrane, ear canal and external ear normal.     Nose:     Right Turbinates: Swollen and pale.     Left Turbinates: Swollen and pale.     Right Sinus: No maxillary sinus tenderness or frontal sinus tenderness.     Left Sinus: No maxillary sinus tenderness or frontal sinus tenderness.      Mouth/Throat:     Pharynx: Posterior oropharyngeal erythema present. No oropharyngeal exudate.     Tonsils: No tonsillar exudate. 2+ on the right. 2+ on the left.  Eyes:  Conjunctiva/sclera: Conjunctivae normal.     Pupils: Pupils are equal, round, and reactive to light.  Neck:     Musculoskeletal: Neck supple.  Cardiovascular:     Rate and Rhythm: Normal rate and regular rhythm.     Heart sounds: Normal heart sounds. No murmur. No friction rub. No gallop.   Pulmonary:     Effort: Pulmonary effort is normal.     Breath sounds: Normal breath sounds. No wheezing, rhonchi or rales.  Abdominal:     General: Bowel sounds are normal. There is no distension.     Palpations: Abdomen is soft. There is no mass.     Tenderness: There is no abdominal tenderness. There is no right CVA tenderness or left CVA tenderness.  Musculoskeletal:     Right lower leg: No edema.     Left lower leg: No edema.  Lymphadenopathy:     Cervical: No cervical adenopathy.  Skin:    General: Skin is warm and dry.  Neurological:     Mental Status: She is alert and oriented to person, place, and time.    Peak flow reading is 330, about 79 % of predicted.  Results for orders placed or performed in visit on 05/08/19 (from the past 24 hour(s))  POCT rapid strep A     Status: None   Collection Time: 05/08/19  2:47 PM  Result Value Ref Range   Rapid Strep A Screen Negative Negative    Dg Abd 2 Views  Result Date: 05/08/2019 CLINICAL DATA:  43 year old with acute onset of vomiting. Personal history of urinary tract calculi. EXAM: ABDOMEN - 2 VIEW COMPARISON:  07/02/2016 and earlier. FINDINGS: Bowel gas pattern unremarkable without evidence of obstruction or significant ileus. No evidence of free air or significant air-fluid levels on the erect image. Expected stool burden in the colon. No visible opaque urinary tract calculi currently. Phleboliths low in the RIGHT side of the pelvis. Prior L4-5 fusion. Stable small  acetabular rim calcification on the LEFT. IMPRESSION: No acute abdominal abnormality. No visible opaque urinary tract calculi currently. Electronically Signed   By: Evangeline Dakin M.D.   On: 05/08/2019 15:26           ASSESSMENT and PLAN  1. Moderate persistent asthmatic bronchitis with acute exacerbation Patient with recent worsening of allergies resulting in sign increase in albuterol use and decreased peak flow.  pred burst given. Reviewed r/se/b. Keep upcoming appt with asthma. RTC precautions given.  2. Seasonal allergies Uncontrolled. Adding flonase, consider changing oral antihistamine or adding eye ggt.   3. Sore throat - POCT rapid strep A - neg, most likely viral - Culture, Group A Strep  4. Non-intractable vomiting with nausea, unspecified vomiting type - DG Abd 2 Views; Future - no kidney stones noted.  Has appt tomorrow with GI - keep pushing fluids.   Other orders - Check Peak Flow - fluticasone (FLONASE) 50 MCG/ACT nasal spray; Place 1 spray into both nostrils 2 (two) times daily. - predniSONE (DELTASONE) 20 MG tablet; Take 2 tablets (40 mg total) by mouth daily with breakfast.  Return in about 1 week (around 05/15/2019).    Rutherford Guys, MD [facility] at [place], [place] [address] Ph.  [phone] Fax [phone]

## 2019-05-09 ENCOUNTER — Other Ambulatory Visit (INDEPENDENT_AMBULATORY_CARE_PROVIDER_SITE_OTHER): Payer: BC Managed Care – PPO

## 2019-05-09 ENCOUNTER — Encounter: Payer: Self-pay | Admitting: Gastroenterology

## 2019-05-09 ENCOUNTER — Ambulatory Visit: Payer: BC Managed Care – PPO | Admitting: Gastroenterology

## 2019-05-09 VITALS — BP 110/60 | HR 92 | Temp 98.2°F | Ht 62.75 in | Wt 190.2 lb

## 2019-05-09 DIAGNOSIS — K219 Gastro-esophageal reflux disease without esophagitis: Secondary | ICD-10-CM

## 2019-05-09 DIAGNOSIS — R059 Cough, unspecified: Secondary | ICD-10-CM

## 2019-05-09 DIAGNOSIS — Z1159 Encounter for screening for other viral diseases: Secondary | ICD-10-CM | POA: Diagnosis not present

## 2019-05-09 DIAGNOSIS — R197 Diarrhea, unspecified: Secondary | ICD-10-CM

## 2019-05-09 DIAGNOSIS — R05 Cough: Secondary | ICD-10-CM | POA: Diagnosis not present

## 2019-05-09 LAB — HEPATIC FUNCTION PANEL
ALT: 17 U/L (ref 0–35)
AST: 16 U/L (ref 0–37)
Albumin: 4.4 g/dL (ref 3.5–5.2)
Alkaline Phosphatase: 59 U/L (ref 39–117)
Bilirubin, Direct: 0.1 mg/dL (ref 0.0–0.3)
Total Bilirubin: 0.4 mg/dL (ref 0.2–1.2)
Total Protein: 7.2 g/dL (ref 6.0–8.3)

## 2019-05-09 LAB — IGA: IgA: 219 mg/dL (ref 68–378)

## 2019-05-09 MED ORDER — FAMOTIDINE 40 MG PO TABS: 40.0000 mg | ORAL_TABLET | Freq: Every day | ORAL | 11 refills | Status: AC

## 2019-05-09 MED ORDER — RABEPRAZOLE SODIUM 20 MG PO TBEC: DELAYED_RELEASE_TABLET | ORAL | 11 refills | Status: DC

## 2019-05-09 MED ORDER — DICYCLOMINE HCL 10 MG PO CAPS: 10.0000 mg | ORAL_CAPSULE | Freq: Three times a day (TID) | ORAL | 11 refills | Status: DC

## 2019-05-09 MED ORDER — NA SULFATE-K SULFATE-MG SULF 17.5-3.13-1.6 GM/177ML PO SOLN: 1.0000 | Freq: Once | ORAL | 0 refills | Status: AC

## 2019-05-09 NOTE — Progress Notes (Signed)
    History of Present Illness: This is a 43 year old female referred by Sheena Rockers, MD for the evaluation of cough, GERD, nausea, vomiting, reflux, diarrhea, crampy lower abdominal pain.  She relates problems for several years with all above symptoms.  She states in the morning she has problems with nausea and frequently vomits.  She also notes 3 urgent loose stools with lower abdominal cramping each morning.  She states she will often have an urgent looser stool in the afternoon as well.  She was started on AcipHex 20 mg daily and her heartburn has improved however the other symptoms persist.  She previously underwent colonoscopy and EGD by Dr. Penelope Coop in 2007.  Her colonoscopy was normal.  EGD showed H. pylori chronic gastritis. Denies weight loss,  constipation, change in stool caliber, melena, hematochezia, dysphagia, chest pain.   Review of Systems: Pertinent positive and negative review of systems were noted in the above HPI section. All other review of systems were otherwise negative.    Physical Exam: General: Well developed, well nourished, no acute distress Head: Normocephalic and atraumatic Eyes:  sclerae anicteric, EOMI Ears: Normal auditory acuity Mouth: No deformity or lesions Neck: Supple, no masses or thyromegaly Lungs: Clear throughout to auscultation Heart: Regular rate and rhythm; no murmurs, rubs or bruits Abdomen: Soft, non tender and non distended. No masses, hepatosplenomegaly or hernias noted. Normal Bowel sounds Rectal: Deferred to colonoscopy Musculoskeletal: Symmetrical with no gross deformities  Skin: No lesions on visible extremities Pulses:  Normal pulses noted Extremities: No clubbing, cyanosis, edema or deformities noted Neurological: Alert oriented x 4, grossly nonfocal Cervical Nodes:  No significant cervical adenopathy Inguinal Nodes: No significant inguinal adenopathy Psychological:  Alert and cooperative. Normal mood and affect   Assessment and  Recommendations:  1. GERD, possible LPR. Cough could be related to GERD, LPR, asthma, allergies, etc.  Increase AcipHex to 20 mg p.o. twice daily, taken before breakfast and dinner.  Increase famotidine to 40 mg at bedtime.  Intensify all antireflux measures.  Begin the use of 4 inch bed blocks.  Schedule EGD. The risks (including bleeding, perforation, infection, missed lesions, medication reactions and possible hospitalization or surgery if complications occur), benefits, and alternatives to endoscopy with possible biopsy and possible dilation were discussed with the patient and they consent to proceed.   2. Diarrhea, abdominal cramping.  Suspected IBS.  Rule out microscopic colitis, IBD, celiac disease.  LFTs, tTG, IgA, TSH. Avoid foods that cause symptoms.  Begin dicyclomine 10 mg p.o. 4 times daily taken before meals and at bedtime.  Schedule colonoscopy. The risks (including bleeding, perforation, infection, missed lesions, medication reactions and possible hospitalization or surgery if complications occur), benefits, and alternatives to colonoscopy with possible biopsy and possible polypectomy were discussed with the patient and they consent to proceed.     cc: Sheena Rockers, MD [address] [place],  [place] [address]

## 2019-05-09 NOTE — Patient Instructions (Signed)
Your provider has requested that you go to the basement level for lab work before leaving today. Press "B" on the elevator. The lab is located at the first door on the left as you exit the elevator.  Increase your Aciphex to 20 mg twice daily and famotidine to 40 mg at bedtime. A new prescription has been sent to your pharmacy for both medications along with dicyclomine.   Patient advised to avoid spicy, acidic, citrus, chocolate, mints, fruit and fruit juices.  Limit the intake of caffeine, alcohol and Soda.  Don't exercise too soon after eating.  Don't lie down within 3-4 hours of eating.  Elevate the head of your bed with 4 " bed blocks.   You have been scheduled for an endoscopy and colonoscopy. Please follow the written instructions given to you at your visit today. Please pick up your prep supplies at the pharmacy within the next 1-3 days. If you use inhalers (even only as needed), please bring them with you on the day of your procedure. Your physician has requested that you go to www.startemmi.com and enter the access code given to you at your visit today. This web site gives a general overview about your procedure. However, you should still follow specific instructions given to you by our office regarding your preparation for the procedure.  Thank you for choosing me and [facility].  Pricilla Riffle. Dagoberto Ligas., MD., [facility]

## 2019-05-10 LAB — CULTURE, GROUP A STREP: Strep A Culture: NEGATIVE

## 2019-05-10 LAB — TISSUE TRANSGLUTAMINASE, IGA: (tTG) Ab, IgA: 1 U/mL

## 2019-05-11 LAB — TSH: TSH: 1.08 u[IU]/mL (ref 0.35–4.50)

## 2019-05-19 ENCOUNTER — Other Ambulatory Visit: Payer: Self-pay | Admitting: Gastroenterology

## 2019-05-19 ENCOUNTER — Encounter: Payer: Self-pay | Admitting: Gastroenterology

## 2019-05-19 DIAGNOSIS — Z1159 Encounter for screening for other viral diseases: Secondary | ICD-10-CM | POA: Diagnosis not present

## 2019-05-19 LAB — SARS CORONAVIRUS 2 (TAT 6-24 HRS): SARS Coronavirus 2: NEGATIVE

## 2019-05-24 ENCOUNTER — Other Ambulatory Visit: Payer: Self-pay

## 2019-05-24 ENCOUNTER — Encounter: Payer: Self-pay | Admitting: Gastroenterology

## 2019-05-24 ENCOUNTER — Ambulatory Visit (AMBULATORY_SURGERY_CENTER): Payer: BC Managed Care – PPO | Admitting: Gastroenterology

## 2019-05-24 VITALS — BP 107/43 | HR 86 | Temp 98.7°F | Resp 35 | Ht 62.75 in | Wt 190.0 lb

## 2019-05-24 DIAGNOSIS — K648 Other hemorrhoids: Secondary | ICD-10-CM

## 2019-05-24 DIAGNOSIS — R112 Nausea with vomiting, unspecified: Secondary | ICD-10-CM | POA: Diagnosis not present

## 2019-05-24 DIAGNOSIS — R197 Diarrhea, unspecified: Secondary | ICD-10-CM

## 2019-05-24 DIAGNOSIS — K449 Diaphragmatic hernia without obstruction or gangrene: Secondary | ICD-10-CM

## 2019-05-24 DIAGNOSIS — K635 Polyp of colon: Secondary | ICD-10-CM

## 2019-05-24 DIAGNOSIS — K2100 Gastro-esophageal reflux disease with esophagitis, without bleeding: Secondary | ICD-10-CM

## 2019-05-24 DIAGNOSIS — D125 Benign neoplasm of sigmoid colon: Secondary | ICD-10-CM

## 2019-05-24 DIAGNOSIS — K219 Gastro-esophageal reflux disease without esophagitis: Secondary | ICD-10-CM

## 2019-05-24 MED ORDER — SODIUM CHLORIDE 0.9 % IV SOLN: 500.0000 mL | Freq: Once | INTRAVENOUS | Status: DC

## 2019-05-24 NOTE — Op Note (Signed)
[facility] Patient Name: Sheena Young Procedure Date: 05/24/2019 1:36 PM MRN: [id number] Endoscopist: Ladene Artist , MD Age: 43 Referring MD:  Date of Birth: Mar 08, 1976 Gender: Female Account #: [id number] Procedure:                Colonoscopy Indications:              Clinically significant diarrhea of unexplained                            origin Medicines:                Monitored Anesthesia Care Procedure:                Pre-Anesthesia Assessment:                           - Prior to the procedure, a History and Physical                            was performed, and patient medications and                            allergies were reviewed. The patient's tolerance of                            previous anesthesia was also reviewed. The risks                            and benefits of the procedure and the sedation                            options and risks were discussed with the patient.                            All questions were answered, and informed consent                            was obtained. Prior Anticoagulants: The patient has                            taken no previous anticoagulant or antiplatelet                            agents. ASA Grade Assessment: II - A patient with                            mild systemic disease. After reviewing the risks                            and benefits, the patient was deemed in                            satisfactory condition to undergo the procedure.  After obtaining informed consent, the colonoscope                            was passed under direct vision. Throughout the                            procedure, the patient's blood pressure, pulse, and                            oxygen saturations were monitored continuously. The                            Colonoscope was introduced through the anus and                            advanced to the the terminal ileum, with                  identification of the appendiceal orifice and IC                            valve. The terminal ileum, ileocecal valve,                            appendiceal orifice, and rectum were photographed.                            The quality of the bowel preparation was good. The                            colonoscopy was performed without difficulty. The                            patient tolerated the procedure well. Scope In: 1:43:39 PM Scope Out: 2:00:10 PM Scope Withdrawal Time: 0 hours 13 minutes 46 seconds  Total Procedure Duration: 0 hours 16 minutes 31 seconds  Findings:                 The perianal and digital rectal examinations were                            normal.                           The terminal ileum appeared normal.                           A 7 mm polyp was found in the sigmoid colon. The                            polyp was sessile. The polyp was removed with a                            cold snare. Resection and retrieval were complete.  Internal hemorrhoids were found during                            retroflexion. The hemorrhoids were small and Grade                            I (internal hemorrhoids that do not prolapse).                           The exam was otherwise without abnormality on                            direct and retroflexion views. Random biopsies                            obtained throughout. Complications:            No immediate complications. Estimated blood loss:                            None. Estimated Blood Loss:     Estimated blood loss: none. Impression:               - The examined portion of the ileum was normal.                           - One 7 mm polyp in the sigmoid colon, removed with                            a cold snare. Resected and retrieved.                           - Internal hemorrhoids.                           - The examination was otherwise normal on direct                             and retroflexion views. Biopsies obtained. Recommendation:           - Repeat colonoscopy after studies are complete for                            surveillance based on pathology results.                           - Patient has a contact number available for                            emergencies. The signs and symptoms of potential                            delayed complications were discussed with the                            patient. Return to normal activities tomorrow.  Written discharge instructions were provided to the                            patient.                           - Resume previous diet.                           - Continue present medications.                           - Await pathology results. Ladene Artist, MD 05/24/2019 2:09:39 PM This report has been signed electronically.

## 2019-05-24 NOTE — Progress Notes (Signed)
Patient admitted to recovery room.  No response, but pt is restless in her sleep.  All around the bed.  When awake, would not follow directions.  Unable to get a consistent o2 sat.    1442   Patient is awake and coughing less.  Sitting up, and can talk to me without swearing and being restless.  Patient is cooperative at the moment .  States that she if now off opiates and "other drugs" , but she smokes " a lot of marijuana."  Patient used her rescue inhaler at 1445.  Patient refuses to wear mask.  Patient is now calm now , and is able to go home.  Patient spoke in a clear way, and apologized for her behavior.

## 2019-05-24 NOTE — Progress Notes (Signed)
Called to room to assist during endoscopic procedure.  Patient ID and intended procedure confirmed with present staff. Received instructions for my participation in the procedure from the performing physician.  

## 2019-05-24 NOTE — Op Note (Signed)
[facility] Patient Name: Sheena Young Procedure Date: 05/24/2019 1:30 PM MRN: [id number] Endoscopist: Ladene Artist , MD Age: 43 Referring MD:  Date of Birth: [birth year] Gender: Female Account #: [id number] Procedure:                Upper GI endoscopy Indications:              Suspected gastroesophageal reflux disease, Nausea                            with vomiting Medicines:                Monitored Anesthesia Care Procedure:                Pre-Anesthesia Assessment:                           - Prior to the procedure, a History and Physical                            was performed, and patient medications and                            allergies were reviewed. The patient's tolerance of                            previous anesthesia was also reviewed. The risks                            and benefits of the procedure and the sedation                            options and risks were discussed with the patient.                            All questions were answered, and informed consent                            was obtained. Prior Anticoagulants: The patient has                            taken no previous anticoagulant or antiplatelet                            agents. ASA Grade Assessment: II - A patient with                            mild systemic disease. After reviewing the risks                            and benefits, the patient was deemed in                            satisfactory condition to undergo the procedure.  After obtaining informed consent, the endoscope was                            passed under direct vision. Throughout the                            procedure, the patient's blood pressure, pulse, and                            oxygen saturations were monitored continuously. The                            Endoscope was introduced through the mouth, and                            advanced to the second part of  duodenum. The upper                            GI endoscopy was accomplished without difficulty.                            The patient tolerated the procedure well. Scope In: Scope Out: Findings:                 The examined esophagus was normal.                           A small hiatal hernia was present.                           The exam of the stomach was otherwise normal.                           The duodenal bulb and second portion of the                            duodenum were normal. Complications:            No immediate complications. Estimated Blood Loss:     Estimated blood loss: none. Impression:               - Normal esophagus.                           - Small hiatal hernia.                           - Normal duodenal bulb and second portion of the                            duodenum.                           - No specimens collected. Recommendation:           - Patient has a contact number available for  emergencies. The signs and symptoms of potential                            delayed complications were discussed with the                            patient. Return to normal activities tomorrow.                            Written discharge instructions were provided to the                            patient.                           - Resume previous diet.                           - Antireflux measures long term.                           - Possible THC induced nausea, vomiting.                           - Continue present medications. Ladene Artist, MD 05/24/2019 2:13:41 PM This report has been signed electronically.

## 2019-05-24 NOTE — Patient Instructions (Addendum)
Read all handouts given to you by your recovery room nurse.  Read the doctor's report.    YOU HAD AN ENDOSCOPIC PROCEDURE TODAY AT [facility]:   Refer to the procedure report that was given to you for any specific questions about what was found during the examination.  If the procedure report does not answer your questions, please call your gastroenterologist to clarify.  If you requested that your care partner not be given the details of your procedure findings, then the procedure report has been included in a sealed envelope for you to review at your convenience later.  YOU SHOULD EXPECT: Some feelings of bloating in the abdomen. Passage of more gas than usual.  Walking can help get rid of the air that was put into your GI tract during the procedure and reduce the bloating. If you had a lower endoscopy (such as a colonoscopy or flexible sigmoidoscopy) you may notice spotting of blood in your stool or on the toilet paper. If you underwent a bowel prep for your procedure, you may not have a normal bowel movement for a few days.  Please Note:  You might notice some irritation and congestion in your nose or some drainage.  This is from the oxygen used during your procedure.  There is no need for concern and it should clear up in a day or so.  SYMPTOMS TO REPORT IMMEDIATELY:   Following lower endoscopy (colonoscopy or flexible sigmoidoscopy):  Excessive amounts of blood in the stool  Significant tenderness or worsening of abdominal pains  Swelling of the abdomen that is new, acute  Fever of 100F or higher   Following upper endoscopy (EGD)  Vomiting of blood or coffee ground material  New chest pain or pain under the shoulder blades  Painful or persistently difficult swallowing  New shortness of breath  Fever of 100F or higher  Black, tarry-looking stools  For urgent or emergent issues, a gastroenterologist can be reached at any hour by calling [phone].   DIET:  We  do recommend a small meal at first, but then you may proceed to your regular diet.  Drink plenty of fluids but you should avoid alcoholic beverages for 24 hours.  ACTIVITY:  You should plan to take it easy for the rest of today and you should NOT DRIVE or use heavy machinery until tomorrow (because of the sedation medicines used during the test).    FOLLOW UP: Our staff will call the number listed on your records 48-72 hours following your procedure to check on you and address any questions or concerns that you may have regarding the information given to you following your procedure. If we do not reach you, we will leave a message.  We will attempt to reach you two times.  During this call, we will ask if you have developed any symptoms of COVID 19. If you develop any symptoms (ie: fever, flu-like symptoms, shortness of breath, cough etc.) before then, please call [phone].  If you test positive for Covid 19 in the 2 weeks post procedure, please call and report this information to [place].    If any biopsies were taken you will be contacted by phone or by letter within the next 1-3 weeks.  Please call us at [phone] if you have not heard about the biopsies in 3 weeks.    SIGNATURES/CONFIDENTIALITY: You and/or your care partner have signed paperwork which will be entered into your electronic medical record.  These signatures  attest to the fact that that the information above on your After Visit Summary has been reviewed and is understood.  Full responsibility of the confidentiality of this discharge information lies with you and/or your care-partner.

## 2019-05-24 NOTE — Progress Notes (Signed)
To PACu VSS. Report to [email or web address].tb

## 2019-05-24 NOTE — Progress Notes (Signed)
TEMP-JB  V/S-CW 

## 2019-05-26 ENCOUNTER — Telehealth: Payer: Self-pay | Admitting: *Deleted

## 2019-05-26 ENCOUNTER — Telehealth: Payer: Self-pay

## 2019-05-26 NOTE — Telephone Encounter (Signed)
Left message on follow up call. 

## 2019-05-26 NOTE — Telephone Encounter (Signed)
  Follow up Call-  Call back number 05/24/2019  Post procedure Call Back phone  # [phone]  Permission to leave phone message Yes  Some recent data might be hidden     Patient questions:  Do you have a fever, pain , or abdominal swelling? Yes.   Pain Score  0 *  Have you tolerated food without any problems? Yes.    Have you been able to return to your normal activities? Yes.    Do you have any questions about your discharge instructions: Diet   No. Medications  No. Follow up visit  No.  Do you have questions or concerns about your Care? No.  Actions: * If pain score is 4 or above: No action needed, pain <4.    pt. Wanted to know if they were going to do anything about the hernia,informed pt. That she may follow up with the doctor in office but we do not do anything here about hernia,she verbalize understanding.   1. Have you developed a fever since your procedure? no  2.   Have you had an respiratory symptoms (SOB or cough) since your procedure? no  3.   Have you tested positive for COVID 19 since your procedure no  4.   Have you had any family members/close contacts diagnosed with the COVID 19 since your procedure?  no   If yes to any of these questions please route to Joylene John, RN and Alphonsa Gin, Therapist, sports.

## 2019-06-01 ENCOUNTER — Encounter: Payer: Self-pay | Admitting: Gastroenterology

## 2019-06-24 ENCOUNTER — Other Ambulatory Visit: Payer: Self-pay | Admitting: Family Medicine

## 2019-07-04 ENCOUNTER — Ambulatory Visit: Payer: BC Managed Care – PPO | Admitting: Internal Medicine

## 2019-08-29 ENCOUNTER — Other Ambulatory Visit: Payer: Self-pay

## 2019-08-29 ENCOUNTER — Telehealth (INDEPENDENT_AMBULATORY_CARE_PROVIDER_SITE_OTHER): Payer: BC Managed Care – PPO | Admitting: Registered Nurse

## 2019-08-29 VITALS — Temp 97.8°F

## 2019-08-29 DIAGNOSIS — H6690 Otitis media, unspecified, unspecified ear: Secondary | ICD-10-CM | POA: Diagnosis not present

## 2019-08-29 MED ORDER — AMOXICILLIN 875 MG PO TABS: 875.0000 mg | ORAL_TABLET | Freq: Two times a day (BID) | ORAL | 0 refills | Status: DC

## 2019-08-29 NOTE — Progress Notes (Signed)
Patient states in the middle of the night she woke up with severe diarrhea , vomiting , left ear pain on the outside and inside the ear. Per patient states she took some aleve this morning. She states she usually run a fever because has had issues with this ear before and its starting to re occur.

## 2019-08-29 NOTE — Patient Instructions (Signed)
° ° ° °  If you have lab work done today you will be contacted with your lab results within the next 2 weeks.  If you have not heard from us then please contact us. The fastest way to get your results is to register for My Chart. ° ° °IF you received an x-ray today, you will receive an invoice from [facility]. Please contact [facility] at [phone] with questions or concerns regarding your invoice.  ° °IF you received labwork today, you will receive an invoice from [facility]. Please contact [facility] at [phone] with questions or concerns regarding your invoice.  ° °Our billing staff will not be able to assist you with questions regarding bills from these companies. ° °You will be contacted with the lab results as soon as they are available. The fastest way to get your results is to activate your My Chart account. Instructions are located on the last page of this paperwork. If you have not heard from us regarding the results in 2 weeks, please contact this office. °  ° ° ° °

## 2019-09-05 NOTE — Progress Notes (Signed)
Telemedicine Encounter- SOAP NOTE Established Patient  This telephone encounter was conducted with the patient's (or proxy's) verbal consent via audio telecommunications: yes  Patient was instructed to have this encounter in a suitably private space; and to only have persons present to whom they give permission to participate. In addition, patient identity was confirmed by use of name plus two identifiers (DOB and address).  I discussed the limitations, risks, security and privacy concerns of performing an evaluation and management service by telephone and the availability of in person appointments. I also discussed with the patient that there may be a patient responsible charge related to this service. The patient expressed understanding and agreed to proceed.  I spent a total of 13 minutes talking with the patient or their proxy.  No chief complaint on file.   Subjective   Sheena Young is a 44 y.o. established patient. Telephone visit today for suspected ear infection  HPI Pt reports long history of otitis media. History of perforated TM.  Notes that she woke up with vomiting and diarrhea with severe L side ear pain - states that this is always how her ear infections start. Has a fever at this time No respiratory symptoms. No known sick contacts. No drainage or bleeding from ear. Otherwise feels well  Patient Active Problem List   Diagnosis Date Noted  . Cough variant asthma  vs UACS 09/08/2018  . Radiculopathy 03/13/2015  . Perforated ear drum 10/24/2008    Past Medical History:  Diagnosis Date  . Allergy   . Anxiety    but doesn't take any meds for this- ? panic attacks- not diagnosised  . Arthritis   . Asthma   . Chronic back pain   . Gastritis, acute   . GERD (gastroesophageal reflux disease)    takes Zantac prn  . H/O blood clots    pt states calcified clots in pelvis  . Headache(784.0)    occaionally  . History of kidney stones   . History of migraine    last one about 76months  . IBS (irritable bowel syndrome)   . Insomnia   . Joint pain   . Joint swelling   . Neuromuscular disorder ([place])    NERVE DAMAGE RIGHT SIDE  . Overactive bladder    never filled prescription bc unable to afford  . Pneumonia   . Shortness of breath dyspnea    with anxiety  . Substance abuse ([place])    [place]  . Urinary frequency   . Urinary urgency   . Vomiting     Current Outpatient Medications  Medication Sig Dispense Refill  . budesonide-formoterol (SYMBICORT) 80-4.5 MCG/ACT inhaler Inhale 2 puffs into the lungs 2 (two) times daily. 1 Inhaler 11  . cetirizine (ZYRTEC) 10 MG tablet TAKE 1 TABLET(10 MG) BY MOUTH DAILY 90 tablet 3  . dicyclomine (BENTYL) 10 MG capsule Take 1 capsule (10 mg total) by mouth 4 (four) times daily -  before meals and at bedtime. 120 capsule 11  . famotidine (PEPCID) 40 MG tablet Take 1 tablet (40 mg total) by mouth at bedtime. 30 tablet 11  . fluticasone (FLONASE) 50 MCG/ACT nasal spray Place 1 spray into both nostrils 2 (two) times daily. 16 g 6  . montelukast (SINGULAIR) 10 MG tablet TAKE 1 TABLET(10 MG) BY MOUTH AT BEDTIME 30 tablet 5  . PROAIR HFA 108 (90 Base) MCG/ACT inhaler INHALE 2 PUFFS INTO THE LUNGS EVERY 4 HOURS AS NEEDED FOR WHEEZING OR SHORTNESS OF  BREATH OR COUGH 8.5 g 1  . Probiotic Product (PROBIOTIC DAILY PO) Take 1 tablet by mouth daily. Reported on 07/12/2015    . RABEprazole (ACIPHEX) 20 MG tablet Take 30-60 min before breakfast and dinner 60 tablet 11  . amoxicillin (AMOXIL) 875 MG tablet Take 1 tablet (875 mg total) by mouth 2 (two) times daily. 20 tablet 0  . predniSONE (DELTASONE) 20 MG tablet Take 2 tablets (40 mg total) by mouth daily with breakfast. (Patient not taking: Reported on 08/29/2019) 10 tablet 0   No current facility-administered medications for this visit.    Allergies  Allergen Reactions  . Mint Chocolate Chip Flavor     Mint and Chocolate  . Molds & Smuts     Social History    Socioeconomic History  . Marital status: Married    Spouse name: Lennette Bihari  . Number of children: 0  . Years of education: 9th grade  . Highest education level: Not on file  Occupational History  . 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 Smoking status: Former Smoker    Packs/day: 0.25    Years: 8.00    Pack years: 2.00    Types: E-cigarettes, Cigarettes    Quit date: 03/11/2015    Years since quitting: 4.4  . Smokeless tobacco: Never Used  . Tobacco comment: of electronic cigarette  Substance and Sexual Activity  . Alcohol use: Yes    Alcohol/week: 0.0 - 2.0 standard drinks    Comment: reports once every 10 days as of 09/08/2018  . Drug use: Yes    Frequency: 7.0 times per week    Types: Marijuana    Comment: , LAST USED 05/24/19,DAILY  . Sexual activity: Yes    Partners: Male    Birth control/protection: None    Comment: actively trying to get pregnant (10/2015)  Other Topics Concern  . Not on file  Social History Narrative   Lives with her husband, 3 dogs and 5 fish.   Social Determinants of Health   Financial Resource Strain:   . Difficulty of Paying Living Expenses: Not on file  Food Insecurity:   . Worried About Charity fundraiser in the Last Year: Not on file  . Ran Out of Food in the Last Year: Not on file  Transportation Needs:   . Lack of Transportation (Medical): Not on file  . Lack of Transportation (Non-Medical): Not on file  Physical Activity:   . Days of Exercise per Week: Not on file  . Minutes of Exercise per Session: Not on file  Stress:   . Feeling of Stress : Not on file  Social Connections:   . Frequency of Communication with Friends and Family: Not on file  . Frequency of Social Gatherings with Friends and Family: Not on file  . Attends Religious Services: Not on file  . Active Member of Clubs or Organizations: Not on file  . Attends Archivist Meetings: Not on file  .  Marital Status: Not on file  Intimate Partner Violence:   . Fear of Current or Ex-Partner: Not on file  . Emotionally Abused: Not on file  . Physically Abused: Not on file  . Sexually Abused: Not on file    Review of Systems  Constitutional: Negative.   HENT: Positive for ear pain. Negative for congestion, ear discharge, hearing loss, nosebleeds, sinus pain, sore throat and tinnitus.   Eyes: Negative.   Respiratory:  Negative.  Negative for stridor.   Cardiovascular: Negative.   Gastrointestinal: Negative.   Genitourinary: Negative.   Musculoskeletal: Negative.   Skin: Negative.   Neurological: Negative.   Endo/Heme/Allergies: Negative.   Psychiatric/Behavioral: Negative.   All other systems reviewed and are negative.   Objective   Vitals as reported by the patient: Today's Vitals   08/29/19 0840  Temp: 97.8 F (36.6 C)  TempSrc: Temporal    Diagnoses and all orders for this visit:  Acute otitis media, unspecified otitis media type -     amoxicillin (AMOXIL) 875 MG tablet; Take 1 tablet (875 mg total) by mouth 2 (two) times daily.   PLAN  Otitis media suspected  Amoxicillin 875mg  PO bid for 10 days  Return to clinic if symptoms worsen or fail to improve  Patient encouraged to call clinic with any questions, comments, or concerns.  I discussed the assessment and treatment plan with the patient. The patient was provided an opportunity to ask questions and all were answered. The patient agreed with the plan and demonstrated an understanding of the instructions.   The patient was advised to call back or seek an in-person evaluation if the symptoms worsen or if the condition fails to improve as anticipated.  I provided 13 minutes of non-face-to-face time during this encounter.  Maximiano Coss, NP  Primary Care at [facility]

## 2019-09-30 ENCOUNTER — Other Ambulatory Visit: Payer: Self-pay | Admitting: Family Medicine

## 2019-10-04 DIAGNOSIS — U071 COVID-19: Secondary | ICD-10-CM | POA: Diagnosis not present

## 2019-10-04 DIAGNOSIS — Z20828 Contact with and (suspected) exposure to other viral communicable diseases: Secondary | ICD-10-CM | POA: Diagnosis not present

## 2019-10-04 DIAGNOSIS — Z03818 Encounter for observation for suspected exposure to other biological agents ruled out: Secondary | ICD-10-CM | POA: Diagnosis not present

## 2019-10-13 DIAGNOSIS — Z03818 Encounter for observation for suspected exposure to other biological agents ruled out: Secondary | ICD-10-CM | POA: Diagnosis not present

## 2019-10-13 DIAGNOSIS — Z20828 Contact with and (suspected) exposure to other viral communicable diseases: Secondary | ICD-10-CM | POA: Diagnosis not present

## 2019-11-26 ENCOUNTER — Other Ambulatory Visit: Payer: Self-pay | Admitting: Family Medicine

## 2019-11-26 NOTE — Telephone Encounter (Signed)
Requested Prescriptions  Pending Prescriptions Disp Refills  . fluticasone (FLONASE) 50 MCG/ACT nasal spray [Pharmacy Med Name: FLUTICASONE 50MCG NASAL SP (120) RX] 48 g 3    Sig: SHAKE LIQUID AND USE 1 SPRAY IN EACH NOSTRIL TWICE DAILY     Ear, Nose, and Throat: Nasal Preparations - Corticosteroids Passed - 11/26/2019  3:12 AM      Passed - Valid encounter within last 12 months    Recent Outpatient Visits          2 months ago Acute otitis media, unspecified otitis media type   [facility], NP   6 months ago Moderate persistent asthmatic bronchitis with acute exacerbation   [facility], Lilia Argue, MD   1 year ago Moderate persistent asthmatic bronchitis with acute exacerbation   [facility], Lilia Argue, MD   1 year ago Moderate persistent asthmatic bronchitis with acute exacerbation   [facility], Lilia Argue, MD   1 year ago Wheezing   Primary [facility] at [facility], Fenton Malling, MD

## 2019-12-18 ENCOUNTER — Other Ambulatory Visit: Payer: Self-pay | Admitting: Family Medicine

## 2019-12-20 ENCOUNTER — Encounter: Payer: Self-pay | Admitting: Family Medicine
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
## 2020-02-01 ENCOUNTER — Encounter: Payer: Self-pay | Admitting: Family Medicine

## 2020-02-07 ENCOUNTER — Telehealth: Payer: BC Managed Care – PPO | Admitting: Family Medicine

## 2020-02-07 ENCOUNTER — Other Ambulatory Visit: Payer: Self-pay

## 2020-02-07 VITALS — Temp 100.0°F

## 2020-02-07 NOTE — Progress Notes (Signed)
Virtual Visit via Video Note  I called  Sheena Young on 02/07/20 at 554pm  - no answer, left VM that I would call her again.  Repeat call 6:11pm. No answer left message that I will try to reach her again. Repeat call 6:36pm no answer, left vm that will need to reschedule. Will send message to schedulers.   Chief complaint:  Chief Complaint  Patient presents with  . Fever    pt has been having ear ache, vomiting, fever this started about 5 days ago, pt got fever to break today by taking 2 aleve had a high of 104, some light headedness   . Depression    pt screening was 8 pt states she does not beleive she is depressed but has discussed this with Dr Pamella Pert in the past       History of Present Illness: Sheena Young is a 44 y.o. female  Fever, vomiting, earache  Telemedicine visit February 16, reported history of otitis media and perforated TM.  Vomiting, diarrhea and left-sided ear pain at that time.  Suspected otitis media, treated with amoxicillin 875 mg twice daily.  MyChart note reviewed from July 22.  Concern regarding swollen lymph nodes, saw her dentist and was advised that she may need some antibiotics.  Possible ear infection.   Positive depression screening:  Primary care provider is Dr. Pamella Pert Depression screen [facility] 2/9 02/07/2020 08/29/2019 05/08/2019 08/22/2018 07/26/2018  Decreased Interest 2 0 0 0 0  Down, Depressed, Hopeless 0 0 0 0 0  PHQ - 2 Score 2 0 0 0 0  Altered sleeping 1 - - - 0  Tired, decreased energy 2 - - - 0  Change in appetite 1 - - - 0  Feeling bad or failure about yourself  0 - - - 0  Trouble concentrating 2 - - - 0  Moving slowly or fidgety/restless 0 - - - 0  Suicidal thoughts 0 - - - 0  PHQ-9 Score 8 - - - 0  Difficult doing work/chores - - - - Not difficult at all     Patient Active Problem List   Diagnosis Date Noted  . Cough variant asthma  vs UACS 09/08/2018  . Radiculopathy 03/13/2015  . Perforated ear drum 10/24/2008    Past Medical History:  Diagnosis Date  . Allergy   . Anxiety    but doesn't take any meds for this- ? panic attacks- not diagnosised  . Arthritis   . Asthma   . Chronic back pain   . Gastritis, acute   . GERD (gastroesophageal reflux disease)    takes Zantac prn  . H/O blood clots    pt states calcified clots in pelvis  . Headache(784.0)    occaionally  . History of kidney stones   . History of migraine    last one about 55months  . IBS (irritable bowel syndrome)   . Insomnia   . Joint pain   . Joint swelling   . Neuromuscular disorder ([place])    NERVE DAMAGE RIGHT SIDE  . Overactive bladder    never filled prescription bc unable to afford  . Pneumonia   . Shortness of breath dyspnea    with anxiety  . Substance abuse ([place])    [place]  . Urinary frequency   . Urinary urgency   . Vomiting    Past Surgical History:  Procedure Laterality Date  . COLONOSCOPY W/ POLYPECTOMY    . ECTOPIC PREGNANCY SURGERY    .  LUMBAR LAMINECTOMY/DECOMPRESSION MICRODISCECTOMY Right 11/03/2012   Procedure: LUMBAR LAMINECTOMY/DECOMPRESSION MICRODISCECTOMY  Right sided lumbar 4-5 microdisectomy;  Surgeon: Sinclair Ship, MD;  Location: [facility];  Service: Orthopedics;  Laterality: Right;  Right sided lumbar 4-5 microdisectomy  . peridontal surgery    . revision decompression  03/2015   decompression and fusion at L4/5, [place]   Allergies  Allergen Reactions  . Mint Chocolate Chip Flavor     Mint and Chocolate  . Molds & Smuts    Prior to Admission medications   Medication Sig Start Date End Date Taking? Authorizing Provider  budesonide-formoterol (SYMBICORT) 80-4.5 MCG/ACT inhaler Inhale 2 puffs into the lungs 2 (two) times daily. 09/08/18  Yes Tanda Rockers, MD  cetirizine (ZYRTEC) 10 MG tablet TAKE 1 TABLET(10 MG) BY MOUTH DAILY 09/30/19  Yes Rutherford Guys, MD  dicyclomine (BENTYL) 10 MG capsule Take 1 capsule (10 mg total) by mouth 4 (four) times daily -  before meals and at  bedtime. 05/09/19  Yes Ladene Artist, MD  famotidine (PEPCID) 40 MG tablet Take 1 tablet (40 mg total) by mouth at bedtime. 05/09/19  Yes Ladene Artist, MD  fluticasone (FLONASE) 50 MCG/ACT nasal spray SHAKE LIQUID AND USE 1 SPRAY IN EACH NOSTRIL TWICE DAILY 11/26/19  Yes Rutherford Guys, MD  montelukast (SINGULAIR) 10 MG tablet TAKE 1 TABLET(10 MG) BY MOUTH AT BEDTIME 12/18/19  Yes Rutherford Guys, MD  PROAIR HFA 108 [phone] Base) MCG/ACT inhaler INHALE 2 PUFFS INTO THE LUNGS EVERY 4 HOURS AS NEEDED FOR WHEEZING OR SHORTNESS OF BREATH OR COUGH 12/07/18  Yes Rutherford Guys, MD  Probiotic Product (PROBIOTIC DAILY PO) Take 1 tablet by mouth daily. Reported on 07/12/2015   Yes [provider]  RABEprazole (ACIPHEX) 20 MG tablet Take 30-60 min before breakfast and dinner 05/09/19  Yes Ladene Artist, MD  amoxicillin (AMOXIL) 875 MG tablet Take 1 tablet (875 mg total) by mouth 2 (two) times daily. Patient not taking: Reported on 02/07/2020 08/29/19   Maximiano Coss, NP  predniSONE (DELTASONE) 20 MG tablet Take 2 tablets (40 mg total) by mouth daily with breakfast. Patient not taking: Reported on 08/29/2019 05/08/19   Rutherford Guys, MD   Social History   Socioeconomic History  . Marital status: Married    Spouse name: Lennette Bihari  . Number of children: 0  . Years of education: 9th grade  . Highest education level: Not on file  Occupational History  . 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 Smoking status: Former Smoker    Packs/day: 0.25    Years: 8.00    Pack years: 2.00    Types: E-cigarettes, Cigarettes    Quit date: 03/11/2015    Years since quitting: 4.9  . Smokeless tobacco: Never Used  . Tobacco comment: of electronic cigarette  Vaping Use  . Vaping Use: Never used  Substance and Sexual Activity  . Alcohol use: Yes    Alcohol/week: 0.0 - 2.0 standard drinks    Comment: reports once every 10 days as of 09/08/2018   . Drug use: Yes    Frequency: 7.0 times per week    Types: Marijuana    Comment: , LAST USED 05/24/19,DAILY  . Sexual activity: Yes    Partners: Male    Birth control/protection: None    Comment: actively trying to get pregnant (10/2015)  Other Topics Concern  . Not on file  Social  History Narrative   Lives with her husband, 3 dogs and 5 fish.   Social Determinants of Health   Financial Resource Strain:   . Difficulty of Paying Living Expenses:   Food Insecurity:   . Worried About Charity fundraiser in the Last Year:   . Arboriculturist in the Last Year:   Transportation Needs:   . Film/video editor (Medical):   Marland Kitchen Lack of Transportation (Non-Medical):   Physical Activity:   . Days of Exercise per Week:   . Minutes of Exercise per Session:   Stress:   . Feeling of Stress :   Social Connections:   . Frequency of Communication with Friends and Family:   . Frequency of Social Gatherings with Friends and Family:   . Attends Religious Services:   . Active Member of Clubs or Organizations:   . Attends Archivist Meetings:   Marland Kitchen Marital Status:   Intimate Partner Violence:   . Fear of Current or Ex-Partner:   . Emotionally Abused:   Marland Kitchen Physically Abused:   . Sexually Abused:     Observations/Objective: Unable to reach.    Assessment and Plan: No diagnosis found.   Follow Up Instructions:   Wendie Agreste, MD

## 2020-02-07 NOTE — Patient Instructions (Signed)
° ° ° °  If you have lab work done today you will be contacted with your lab results within the next 2 weeks.  If you have not heard from us then please contact us. The fastest way to get your results is to register for My Chart. ° ° °IF you received an x-ray today, you will receive an invoice from [facility]. Please contact [facility] at [phone] with questions or concerns regarding your invoice.  ° °IF you received labwork today, you will receive an invoice from [facility]. Please contact [facility] at [phone] with questions or concerns regarding your invoice.  ° °Our billing staff will not be able to assist you with questions regarding bills from these companies. ° °You will be contacted with the lab results as soon as they are available. The fastest way to get your results is to activate your My Chart account. Instructions are located on the last page of this paperwork. If you have not heard from us regarding the results in 2 weeks, please contact this office. °  ° ° ° °

## 2020-02-08 ENCOUNTER — Other Ambulatory Visit: Payer: Self-pay

## 2020-02-08 ENCOUNTER — Encounter: Payer: Self-pay | Admitting: Family Medicine

## 2020-02-08 ENCOUNTER — Encounter (HOSPITAL_COMMUNITY): Payer: Self-pay

## 2020-02-08 ENCOUNTER — Ambulatory Visit (HOSPITAL_COMMUNITY)
Admission: EM | Admit: 2020-02-08 | Discharge: 2020-02-08 | Disposition: A | Discharge status: Home / Self Care | Payer: BC Managed Care – PPO | Attending: Family Medicine | Admitting: Family Medicine

## 2020-02-08 DIAGNOSIS — H66015 Acute suppurative otitis media with spontaneous rupture of ear drum, recurrent, left ear: Secondary | ICD-10-CM

## 2020-02-08 MED ORDER — AMOXICILLIN-POT CLAVULANATE 875-125 MG PO TABS: 1.0000 | ORAL_TABLET | Freq: Two times a day (BID) | ORAL | 0 refills | Status: DC

## 2020-02-08 MED ORDER — HYDROCODONE-ACETAMINOPHEN 5-325 MG PO TABS: 1.0000 | ORAL_TABLET | Freq: Four times a day (QID) | ORAL | 0 refills | Status: DC | PRN

## 2020-02-08 NOTE — Progress Notes (Signed)
Pt refused appt and has decided to find another Primary Care Provider

## 2020-02-08 NOTE — ED Provider Notes (Signed)
[facility]    CSN: [id number] Arrival date & time: 02/08/20  1216      History   Chief Complaint Chief Complaint  Patient presents with  . Otalgia    HPI Sheena Young is a 44 y.o. female.   HPI  Patient has a history of asthma and allergies She states that she has had recurring ear infections in her left ear with rupture of the tympanic membrane She has had ear pain for about a week, getting worse.  She has had drainage out of her left ear.  Feels like the hearing is diminished in her left ear. Denies runny nose stuffy nose or sinus symptoms She states she did have a fever yesterday No cough or chest congestion She states she has had her vaccinations for Covid  Past Medical History:  Diagnosis Date  . Allergy   . Anxiety    but doesn't take any meds for this- ? panic attacks- not diagnosised  . Arthritis   . Asthma   . Chronic back pain   . Gastritis, acute   . GERD (gastroesophageal reflux disease)    takes Zantac prn  . H/O blood clots    pt states calcified clots in pelvis  . Headache(784.0)    occaionally  . History of kidney stones   . History of migraine    last one about 14months  . IBS (irritable bowel syndrome)   . Insomnia   . Joint pain   . Joint swelling   . Neuromuscular disorder ([place])    NERVE DAMAGE RIGHT SIDE  . Overactive bladder    never filled prescription bc unable to afford  . Pneumonia   . Shortness of breath dyspnea    with anxiety  . Substance abuse ([place])    [place]  . Urinary frequency   . Urinary urgency   . Vomiting     Patient Active Problem List   Diagnosis Date Noted  . Cough variant asthma  vs UACS 09/08/2018  . Radiculopathy 03/13/2015  . 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 COLONOSCOPY W/ POLYPECTOMY    . ECTOPIC PREGNANCY SURGERY    . LUMBAR LAMINECTOMY/DECOMPRESSION MICRODISCECTOMY Right 11/03/2012   Procedure: LUMBAR  LAMINECTOMY/DECOMPRESSION MICRODISCECTOMY  Right sided lumbar 4-5 microdisectomy;  Surgeon: Sinclair Ship, MD;  Location: [place];  Service: Orthopedics;  Laterality: Right;  Right sided lumbar 4-5 microdisectomy  . peridontal surgery    . revision decompression  03/2015   decompression and fusion at L4/5, [place]    OB History   No obstetric history on file.      Home Medications    Prior to Admission medications   Medication Sig Start Date End Date Taking? Authorizing Provider  amoxicillin-clavulanate (AUGMENTIN) 875-125 MG tablet Take 1 tablet by mouth every 12 (twelve) hours. 02/08/20   Raylene Everts, MD  budesonide-formoterol [facility]) 80-4.5 MCG/ACT inhaler Inhale 2 puffs into the lungs 2 (two) times daily. 09/08/18   Tanda Rockers, MD  cetirizine (ZYRTEC) 10 MG tablet TAKE 1 TABLET(10 MG) BY MOUTH DAILY 09/30/19   Rutherford Guys, MD  dicyclomine (BENTYL) 10 MG capsule Take 1 capsule (10 mg total) by mouth 4 (four) times daily -  before meals and at bedtime. 05/09/19   Ladene Artist, MD  famotidine (PEPCID) 40 MG tablet Take 1 tablet (40 mg total) by mouth at bedtime. 05/09/19   Ladene Artist, MD  fluticasone (  FLONASE) 50 MCG/ACT nasal spray SHAKE LIQUID AND USE 1 SPRAY IN EACH NOSTRIL TWICE DAILY 11/26/19   Rutherford Guys, MD  HYDROcodone-acetaminophen (NORCO/VICODIN) 5-325 MG tablet Take 1-2 tablets by mouth every 6 (six) hours as needed. 02/08/20   Raylene Everts, MD  montelukast (SINGULAIR) 10 MG tablet TAKE 1 TABLET(10 MG) BY MOUTH AT BEDTIME 12/18/19   Rutherford Guys, MD  [facility] HFA 108 [phone] Base) MCG/ACT inhaler INHALE 2 PUFFS INTO THE LUNGS EVERY 4 HOURS AS NEEDED FOR WHEEZING OR SHORTNESS OF BREATH OR COUGH 12/07/18   Rutherford Guys, MD  Probiotic Product (PROBIOTIC DAILY PO) Take 1 tablet by mouth daily. Reported on 07/12/2015    [provider]  RABEprazole (ACIPHEX) 20 MG tablet Take 30-60 min before breakfast and dinner 05/09/19   Ladene Artist, MD    Family History Family History  Problem Relation Age of Onset  . Cervical cancer Sister   . Diabetes Maternal Grandmother   . Congestive Heart Failure Maternal Grandmother   . Lung disease Maternal Grandmother   . Diabetes Maternal Aunt        x 3  . Diabetes Maternal Uncle        x 3  . Heart attack Paternal Grandfather   . Heart disease Maternal Uncle   . Liver disease Maternal Uncle   . Clotting disorder Maternal Grandfather   . Colon cancer Paternal Grandmother   . Colon polyps Neg Hx   . Kidney disease Neg Hx   . Esophageal cancer Neg Hx   . Rectal cancer Neg Hx   . Stomach cancer Neg Hx     Social History Social History   Tobacco Use  . Smoking status: Former Smoker    Packs/day: 0.25    Years: 8.00    Pack years: 2.00    Types: E-cigarettes, Cigarettes    Quit date: 03/11/2015    Years since quitting: 4.9  . Smokeless tobacco: Never Used  . Tobacco comment: of electronic cigarette  Vaping Use  . Vaping Use: Never used  Substance Use Topics  . Alcohol use: Yes    Alcohol/week: 0.0 - 2.0 standard drinks    Comment: reports once every 10 days as of 09/08/2018  . Drug use: Yes    Frequency: 7.0 times per week    Types: Marijuana    Comment: , LAST USED 05/24/19,DAILY     Allergies   Mint chocolate chip flavor and Molds & smuts   Review of Systems Review of Systems See HPI  Physical Exam Triage Vital Signs ED Triage Vitals  Enc Vitals Group     BP 02/08/20 1400 (!) 145/90     Pulse Rate 02/08/20 1400 87     Resp 02/08/20 1400 16     Temp 02/08/20 1400 98.5 F (36.9 C)     Temp src --      SpO2 02/08/20 1400 97 %     Weight --      Height --      Head Circumference --      Peak Flow --      Pain Score 02/08/20 1419 10     Pain Loc --      Pain Edu? --      Excl. in [place]? --    No data found.  Updated Vital Signs BP (!) 145/90   Pulse 87   Temp 98.5 F (36.9 C)   Resp 16   LMP 01/11/2020  SpO2 97%      Physical  Exam Constitutional:      General: She is not in acute distress.    Appearance: She is well-developed.  HENT:     Head: Normocephalic and atraumatic.     Right Ear: Tympanic membrane, ear canal and external ear normal. There is no impacted cerumen.     Left Ear: There is no impacted cerumen.     Ears:     Comments: Clear yellow fluid in the external auditory canal.  Mild pain with traction of pinna.  The TM is dull and red.  There is a small perforation at 11:00 Eyes:     Conjunctiva/sclera: Conjunctivae normal.     Pupils: Pupils are equal, round, and reactive to light.  Cardiovascular:     Rate and Rhythm: Normal rate.  Pulmonary:     Effort: Pulmonary effort is normal. No respiratory distress.  Abdominal:     General: There is no distension.     Palpations: Abdomen is soft.  Musculoskeletal:        General: Normal range of motion.     Cervical back: Normal range of motion.  Lymphadenopathy:     Cervical: No cervical adenopathy.  Skin:    General: Skin is warm and dry.  Neurological:     Mental Status: She is alert.  Psychiatric:        Mood and Affect: Mood normal.        Behavior: Behavior normal.      UC Treatments / Results  Labs (all labs ordered are listed, but only abnormal results are displayed) Labs Reviewed - No data to display  EKG   Radiology No results found.  Procedures Procedures (including critical care time)  Medications Ordered in UC Medications - No data to display  Initial Impression / Assessment and Plan / UC Course  I have reviewed the triage vital signs and the nursing notes.  Pertinent labs & imaging results that were available during my care of the patient were reviewed by me and considered in my medical decision making (see chart for details).     Final Clinical Impressions(s) / UC Diagnoses   Final diagnoses:  Recurrent acute suppurative otitis media with spontaneous rupture of left tympanic membrane     Discharge  Instructions     Avoid getting water in your ear Take antibiotic 2 times a day for 7 days Consider taking a probiotic while you are on this antibiotic Take pain medication as needed Follow-up with your usual physician   ED Prescriptions    Medication Sig Dispense Auth. Provider   amoxicillin-clavulanate (AUGMENTIN) 875-125 MG tablet Take 1 tablet by mouth every 12 (twelve) hours. 14 tablet Raylene Everts, MD   HYDROcodone-acetaminophen (NORCO/VICODIN) 5-325 MG tablet Take 1-2 tablets by mouth every 6 (six) hours as needed. 10 tablet Raylene Everts, MD     I have reviewed the PDMP during this encounter.   Raylene Everts, MD 02/08/20 1425

## 2020-02-08 NOTE — Telephone Encounter (Signed)
Pt was scheduled for a virtual visit and never connected with provider. She states she waited 4 days for the appt and when she called in the next morning requesting an office visit; there was no availability. Provider schedule full, overbook was denied, pt was advised to go to urgent care.  Pt upset with care and has informed office that she will be leaving and seeking a new PCP

## 2020-02-08 NOTE — Discharge Instructions (Signed)
Avoid getting water in your ear Take antibiotic 2 times a day for 7 days Consider taking a probiotic while you are on this antibiotic Take pain medication as needed Follow-up with your usual physician

## 2020-02-08 NOTE — ED Triage Notes (Signed)
Pt c/o bilateral ear pain x 1 week, reports fever yesterday and clear/yellow drainage from left ear

## 2020-02-15 ENCOUNTER — Other Ambulatory Visit: Payer: Self-pay

## 2020-02-15 ENCOUNTER — Encounter: Payer: Self-pay | Admitting: Family Medicine

## 2020-02-15 ENCOUNTER — Ambulatory Visit (INDEPENDENT_AMBULATORY_CARE_PROVIDER_SITE_OTHER): Payer: BC Managed Care – PPO | Admitting: Family Medicine

## 2020-02-15 VITALS — BP 124/83 | HR 71 | Temp 98.3°F | Ht 64.0 in | Wt 195.6 lb

## 2020-02-15 DIAGNOSIS — B373 Candidiasis of vulva and vagina: Secondary | ICD-10-CM

## 2020-02-15 DIAGNOSIS — B3731 Acute candidiasis of vulva and vagina: Secondary | ICD-10-CM

## 2020-02-15 DIAGNOSIS — H6982 Other specified disorders of Eustachian tube, left ear: Secondary | ICD-10-CM

## 2020-02-15 DIAGNOSIS — H66015 Acute suppurative otitis media with spontaneous rupture of ear drum, recurrent, left ear: Secondary | ICD-10-CM | POA: Diagnosis not present

## 2020-02-15 MED ORDER — FLUCONAZOLE 150 MG PO TABS: 150.0000 mg | ORAL_TABLET | Freq: Once | ORAL | 0 refills | Status: AC

## 2020-02-15 MED ORDER — IPRATROPIUM BROMIDE 0.03 % NA SOLN: 2.0000 | Freq: Two times a day (BID) | NASAL | 0 refills | Status: DC

## 2020-02-15 NOTE — Patient Instructions (Signed)
° ° ° °  If you have lab work done today you will be contacted with your lab results within the next 2 weeks.  If you have not heard from us then please contact us. The fastest way to get your results is to register for My Chart. ° ° °IF you received an x-ray today, you will receive an invoice from [facility]. Please contact [facility] at [phone] with questions or concerns regarding your invoice.  ° °IF you received labwork today, you will receive an invoice from [facility]. Please contact [facility] at [phone] with questions or concerns regarding your invoice.  ° °Our billing staff will not be able to assist you with questions regarding bills from these companies. ° °You will be contacted with the lab results as soon as they are available. The fastest way to get your results is to activate your My Chart account. Instructions are located on the last page of this paperwork. If you have not heard from us regarding the results in 2 weeks, please contact this office. °  ° ° ° °

## 2020-02-15 NOTE — Progress Notes (Signed)
8/5/202112:01 PM  Sheena Young [birth year], 44 y.o., female [id number]  Chief Complaint  Patient presents with  . Otalgia    Pt stated that she has been experiencing Lt ear pain for the past 3wks. It has been throbbing and draining yellow mucous     HPI:   Patient is a 44 y.o. female who presents today for left ear pain  [facility] on July 29 for recurrent AOM with perforation, left ear Treated with amoxicillin, completed yesterday Patient reports fevers resolved Continues to have intermittent increased pressure, popping, continues to have drainage, hearing is ok Requesting tx for yeast infection she got after augmentin   Depression screen [facility] 2/9 02/15/2020 02/07/2020 08/29/2019  Decreased Interest 0 2 0  Down, Depressed, Hopeless 0 0 0  PHQ - 2 Score 0 2 0  Altered sleeping 0 1 -  Tired, decreased energy 0 2 -  Change in appetite 0 1 -  Feeling bad or failure about yourself  0 0 -  Trouble concentrating 0 2 -  Moving slowly or fidgety/restless 0 0 -  Suicidal thoughts 0 0 -  PHQ-9 Score 0 8 -  Difficult doing work/chores - - -    Fall Risk  02/15/2020 02/07/2020 08/29/2019 05/08/2019 08/22/2018  Falls in the past year? 0 0 1 1 0  Number falls in past yr: 0 0 1 1 0  Injury with Fall? 0 0 1 1 0  Risk for fall due to : - No Fall Risks - - -  Follow up Falls evaluation completed Falls evaluation completed Falls evaluation completed Falls evaluation completed -     Allergies  Allergen Reactions  . Mint Chocolate Chip Flavor     Mint and Chocolate  . Molds & Smuts     Prior to Admission medications   Medication Sig Start Date End Date Taking? Authorizing Provider  amoxicillin-clavulanate (AUGMENTIN) 875-125 MG tablet Take 1 tablet by mouth every 12 (twelve) hours. 02/08/20  Yes Raylene Everts, MD  budesonide-formoterol [facility]) 80-4.5 MCG/ACT inhaler Inhale 2 puffs into the lungs 2 (two) times daily. 09/08/18  Yes Tanda Rockers, MD  cetirizine (ZYRTEC) 10 MG  tablet TAKE 1 TABLET(10 MG) BY MOUTH DAILY 09/30/19  Yes Rutherford Guys, MD  dicyclomine (BENTYL) 10 MG capsule Take 1 capsule (10 mg total) by mouth 4 (four) times daily -  before meals and at bedtime. 05/09/19  Yes Ladene Artist, MD  famotidine (PEPCID) 40 MG tablet Take 1 tablet (40 mg total) by mouth at bedtime. 05/09/19  Yes Ladene Artist, MD  fluticasone (FLONASE) 50 MCG/ACT nasal spray SHAKE LIQUID AND USE 1 SPRAY IN EACH NOSTRIL TWICE DAILY 11/26/19  Yes Rutherford Guys, MD  HYDROcodone-acetaminophen (NORCO/VICODIN) 5-325 MG tablet Take 1-2 tablets by mouth every 6 (six) hours as needed. 02/08/20  Yes Raylene Everts, MD  montelukast (SINGULAIR) 10 MG tablet TAKE 1 TABLET(10 MG) BY MOUTH AT BEDTIME 12/18/19  Yes Rutherford Guys, MD  [facility] HFA 108 [phone] Base) MCG/ACT inhaler INHALE 2 PUFFS INTO THE LUNGS EVERY 4 HOURS AS NEEDED FOR WHEEZING OR SHORTNESS OF BREATH OR COUGH 12/07/18  Yes Rutherford Guys, MD  Probiotic Product (PROBIOTIC DAILY PO) Take 1 tablet by mouth daily. Reported on 07/12/2015   Yes [provider]  RABEprazole (ACIPHEX) 20 MG tablet Take 30-60 min before breakfast and dinner 05/09/19  Yes Ladene Artist, MD    Past Medical History:  Diagnosis Date  .  Allergy   . Anxiety    but doesn't take any meds for this- ? panic attacks- not diagnosised  . Arthritis   . Asthma   . Chronic back pain   . Gastritis, acute   . GERD (gastroesophageal reflux disease)    takes Zantac prn  . H/O blood clots    pt states calcified clots in pelvis  . Headache(784.0)    occaionally  . History of kidney stones   . History of migraine    last one about 57months  . IBS (irritable bowel syndrome)   . Insomnia   . Joint pain   . Joint swelling   . Neuromuscular disorder ([place])    NERVE DAMAGE RIGHT SIDE  . Overactive bladder    never filled prescription bc unable to afford  . Pneumonia   . Shortness of breath dyspnea    with anxiety  . Substance abuse ([place])     [place]  . Urinary frequency   . Urinary urgency   . Vomiting     Past Surgical History:  Procedure Laterality Date  . COLONOSCOPY W/ POLYPECTOMY    . ECTOPIC PREGNANCY SURGERY    . LUMBAR LAMINECTOMY/DECOMPRESSION MICRODISCECTOMY Right 11/03/2012   Procedure: LUMBAR LAMINECTOMY/DECOMPRESSION MICRODISCECTOMY  Right sided lumbar 4-5 microdisectomy;  Surgeon: Sinclair Ship, MD;  Location: [place];  Service: Orthopedics;  Laterality: Right;  Right sided lumbar 4-5 microdisectomy  . peridontal surgery    . revision decompression  03/2015   decompression and fusion at L4/5, [facility]    Social History   Tobacco Use  . Smoking status: Former Smoker    Packs/day: 0.25    Years: 8.00    Pack years: 2.00    Types: E-cigarettes, Cigarettes    Quit date: 03/11/2015    Years since quitting: 4.9  . Smokeless tobacco: Never Used  . Tobacco comment: of electronic cigarette  Substance Use Topics  . Alcohol use: Yes    Alcohol/week: 0.0 - 2.0 standard drinks    Comment: reports once every 10 days as of 09/08/2018    Family History  Problem Relation Age of Onset  . Cervical cancer Sister   . Diabetes Maternal Grandmother   . Congestive Heart Failure Maternal Grandmother   . Lung disease Maternal Grandmother   . Diabetes Maternal Aunt        x 3  . Diabetes Maternal Uncle        x 3  . Heart attack Paternal Grandfather   . Heart disease Maternal Uncle   . Liver disease Maternal Uncle   . Clotting disorder Maternal Grandfather   . Colon cancer Paternal Grandmother   . Colon polyps Neg Hx   . Kidney disease Neg Hx   . Esophageal cancer Neg Hx   . Rectal cancer Neg Hx   . Stomach cancer Neg Hx     ROS Per hpi  OBJECTIVE:  Today's Vitals   02/15/20 1134  BP: 124/83  Pulse: 71  Temp: 98.3 F (36.8 C)  TempSrc: Temporal  SpO2: 98%  Weight: 195 lb 9.6 oz (88.7 kg)  Height: 5\' 4"  (1.626 m)   Body mass index is 33.57 kg/m.   Physical Exam Vitals and nursing note  reviewed.  Constitutional:      Appearance: She is well-developed.  HENT:     Head: Normocephalic and atraumatic.     Right Ear: Hearing, tympanic membrane, ear canal and external ear normal.     Left Ear: Hearing, ear  canal and external ear normal. No drainage or swelling. A middle ear effusion is present. No mastoid tenderness. Tympanic membrane is perforated. Tympanic membrane is not erythematous or bulging.  Eyes:     Conjunctiva/sclera: Conjunctivae normal.     Pupils: Pupils are equal, round, and reactive to light.  Cardiovascular:     Rate and Rhythm: Normal rate and regular rhythm.     Heart sounds: Normal heart sounds. No murmur heard.  No friction rub. No gallop.   Pulmonary:     Effort: Pulmonary effort is normal.     Breath sounds: Normal breath sounds. No wheezing, rhonchi or rales.  Musculoskeletal:     Cervical back: Neck supple.  Lymphadenopathy:     Cervical: No cervical adenopathy.  Skin:    General: Skin is warm and dry.  Neurological:     Mental Status: She is alert and oriented to person, place, and time.        No results found for this or any previous visit (from the past 24 hour(s)).  No results found.   ASSESSMENT and PLAN  1. Recurrent acute suppurative otitis media with spontaneous rupture of left tympanic membrane Otitis media resolved. Referring to ent given recurrence and perforation.  - Ambulatory referral to ENT  2. Dysfunction of left eustachian tube Add nasal atrovent. Cont zyrtec, flonase. Discussed oral decongestant.  3. Vaginal yeast infection rx for diflucan given  Other orders - fluconazole (DIFLUCAN) 150 MG tablet; Take 1 tablet (150 mg total) by mouth once for 1 dose. Repeat if needed - ipratropium (ATROVENT) 0.03 % nasal spray; Place 2 sprays into both nostrils 2 (two) times daily.  Return if symptoms worsen or fail to improve.    Rutherford Guys, MD [facility], [place] [address] Ph.   [phone] Fax [phone]

## 2020-02-16 DIAGNOSIS — Z20822 Contact with and (suspected) exposure to covid-19: Secondary | ICD-10-CM | POA: Diagnosis not present

## 2020-02-16 DIAGNOSIS — Z03818 Encounter for observation for suspected exposure to other biological agents ruled out: Secondary | ICD-10-CM | POA: Diagnosis not present

## 2020-02-24 ENCOUNTER — Other Ambulatory Visit: Payer: Self-pay | Admitting: Internal Medicine

## 2020-03-25 ENCOUNTER — Ambulatory Visit (INDEPENDENT_AMBULATORY_CARE_PROVIDER_SITE_OTHER): Payer: BC Managed Care – PPO | Admitting: Otolaryngology

## 2020-03-25 ENCOUNTER — Encounter (INDEPENDENT_AMBULATORY_CARE_PROVIDER_SITE_OTHER): Payer: Self-pay | Admitting: Otolaryngology

## 2020-03-25 ENCOUNTER — Other Ambulatory Visit: Payer: Self-pay

## 2020-03-25 VITALS — Temp 97.9°F

## 2020-03-25 DIAGNOSIS — Z8669 Personal history of other diseases of the nervous system and sense organs: Secondary | ICD-10-CM | POA: Diagnosis not present

## 2020-03-25 DIAGNOSIS — H7202 Central perforation of tympanic membrane, left ear: Secondary | ICD-10-CM | POA: Diagnosis not present

## 2020-03-25 NOTE — Progress Notes (Signed)
HPI: Sheena Young is a 44 y.o. female who presents is referred by Dr. Pamella Pert for evaluation of chronic left ear problems. Patient apparently has had chronic left ear problems ever since 2009 when she developed a bad ear infection that ruptured the TM and she has had a persistent left TM perforation since that time with intermittent drainage. She also has history of allergies and sinus problems and is presently on Singulair, Flonase and cetirizine. She was recently started on Atrovent 0.03% nasal spray because of nasal drainage. She has just recently been treated for a left ear infection that had drainage from the left ear with antibiotics and this has dried up and presently not draining. Of note patient had a CT scan of her sinuses performed in March 2020 that showed clear paranasal sinuses as well as clear middle ear space as well as clear mastoids bilaterally. She had undeveloped frontal sinuses.  Past Medical History:  Diagnosis Date  . Allergy   . Anxiety    but doesn't take any meds for this- ? panic attacks- not diagnosised  . Arthritis   . Asthma   . Chronic back pain   . Gastritis, acute   . GERD (gastroesophageal reflux disease)    takes Zantac prn  . H/O blood clots    pt states calcified clots in pelvis  . Headache(784.0)    occaionally  . History of kidney stones   . History of migraine    last one about 60months  . IBS (irritable bowel syndrome)   . Insomnia   . Joint pain   . Joint swelling   . Neuromuscular disorder ([place])    NERVE DAMAGE RIGHT SIDE  . Overactive bladder    never filled prescription bc unable to afford  . Pneumonia   . Shortness of breath dyspnea    with anxiety  . Substance abuse ([place])    [place]  . Urinary frequency   . Urinary urgency   . Vomiting    Past Surgical History:  Procedure Laterality Date  . COLONOSCOPY W/ POLYPECTOMY    . ECTOPIC PREGNANCY SURGERY    . LUMBAR LAMINECTOMY/DECOMPRESSION MICRODISCECTOMY Right 11/03/2012    Procedure: LUMBAR LAMINECTOMY/DECOMPRESSION MICRODISCECTOMY  Right sided lumbar 4-5 microdisectomy;  Surgeon: Sinclair Ship, MD;  Location: [facility];  Service: Orthopedics;  Laterality: Right;  Right sided lumbar 4-5 microdisectomy  . peridontal surgery    . revision decompression  03/2015   decompression and fusion at L4/5, [place]   Social History   Socioeconomic History  . Marital status: Married    Spouse name: Lennette Bihari  . Number of children: 0  . Years of education: 9th grade  . Highest education level: Not on file  Occupational History  . Occupation: Glass blower/designer    Comment: [employer] with her husband  Tobacco Use  . Smoking status: Former Smoker    Packs/day: 0.25    Years: 8.00    Pack years: 2.00    Types: E-cigarettes, Cigarettes    Quit date: 03/11/2015    Years since quitting: 5.0  . Smokeless tobacco: Never Used  . Tobacco comment: of electronic cigarette  Vaping Use  . Vaping Use: Never used  Substance and Sexual Activity  . Alcohol use: Yes    Alcohol/week: 0.0 - 2.0 standard drinks    Comment: reports once every 10 days as of 09/08/2018  . Drug use: Yes    Frequency: 7.0 times per week    Types: Marijuana  Comment: , LAST USED 05/24/19,DAILY  . Sexual activity: Yes    Partners: Male    Birth control/protection: None    Comment: actively trying to get pregnant (10/2015)  Other Topics Concern  . Not on file  Social History Narrative   Lives with her husband, 3 dogs and 5 fish.   Social Determinants of Health   Financial Resource Strain:   . Difficulty of Paying Living Expenses: Not on file  Food Insecurity:   . Worried About Charity fundraiser in the Last Year: Not on file  . Ran Out of Food in the Last Year: Not on file  Transportation Needs:   . Lack of Transportation (Medical): Not on file  . Lack of Transportation (Non-Medical): Not on file  Physical Activity:   . Days of Exercise per Week: Not on file  . Minutes of  Exercise per Session: Not on file  Stress:   . Feeling of Stress : Not on file  Social Connections:   . Frequency of Communication with Friends and Family: Not on file  . Frequency of Social Gatherings with Friends and Family: Not on file  . Attends Religious Services: Not on file  . Active Member of Clubs or Organizations: Not on file  . Attends Archivist Meetings: Not on file  . Marital Status: Not on file   Family History  Problem Relation Age of Onset  . Cervical cancer Sister   . Diabetes Maternal Grandmother   . Congestive Heart Failure Maternal Grandmother   . Lung disease Maternal Grandmother   . Diabetes Maternal Aunt        x 3  . Diabetes Maternal Uncle        x 3  . Heart attack Paternal Grandfather   . Heart disease Maternal Uncle   . Liver disease Maternal Uncle   . Clotting disorder Maternal Grandfather   . Colon cancer Paternal Grandmother   . Colon polyps Neg Hx   . Kidney disease Neg Hx   . Esophageal cancer Neg Hx   . Rectal cancer Neg Hx   . Stomach cancer Neg Hx    Allergies  Allergen Reactions  . Mint Chocolate Chip Flavor     Mint and Chocolate  . Molds & Smuts    Prior to Admission medications   Medication Sig Start Date End Date Taking? Authorizing Provider  amoxicillin-clavulanate (AUGMENTIN) 875-125 MG tablet Take 1 tablet by mouth every 12 (twelve) hours. 02/08/20  Yes Raylene Everts, MD  cetirizine (ZYRTEC) 10 MG tablet TAKE 1 TABLET(10 MG) BY MOUTH DAILY 09/30/19  Yes Rutherford Guys, MD  dicyclomine (BENTYL) 10 MG capsule Take 1 capsule (10 mg total) by mouth 4 (four) times daily -  before meals and at bedtime. 05/09/19  Yes Ladene Artist, MD  famotidine (PEPCID) 40 MG tablet Take 1 tablet (40 mg total) by mouth at bedtime. 05/09/19  Yes Ladene Artist, MD  fluticasone (FLONASE) 50 MCG/ACT nasal spray SHAKE LIQUID AND USE 1 SPRAY IN EACH NOSTRIL TWICE DAILY 11/26/19  Yes Rutherford Guys, MD  HYDROcodone-acetaminophen  (NORCO/VICODIN) 5-325 MG tablet Take 1-2 tablets by mouth every 6 (six) hours as needed. 02/08/20  Yes Raylene Everts, MD  ipratropium (ATROVENT) 0.03 % nasal spray Place 2 sprays into both nostrils 2 (two) times daily. 02/15/20  Yes Rutherford Guys, MD  montelukast (SINGULAIR) 10 MG tablet TAKE 1 TABLET(10 MG) BY MOUTH AT BEDTIME 12/18/19  Yes Grant Fontana  M, MD  PROAIR HFA 108 (90 Base) MCG/ACT inhaler INHALE 2 PUFFS INTO THE LUNGS EVERY 4 HOURS AS NEEDED FOR WHEEZING OR SHORTNESS OF BREATH OR COUGH 12/07/18  Yes Rutherford Guys, MD  Probiotic Product (PROBIOTIC DAILY PO) Take 1 tablet by mouth daily. Reported on 07/12/2015   Yes [provider]  RABEprazole (ACIPHEX) 20 MG tablet Take 30-60 min before breakfast and dinner 05/09/19  Yes Ladene Artist, MD  SYMBICORT 80-4.5 MCG/ACT inhaler INHALE 2 PUFFS INTO THE LUNGS TWICE DAILY 02/26/20  Yes Tanda Rockers, MD     Positive ROS: Otherwise negative  All other systems have been reviewed and were otherwise negative with the exception of those mentioned in the HPI and as above.  Physical Exam: Constitutional: Alert, well-appearing, no acute distress Ears: External ears without lesions or tenderness. Ear canals are clear bilaterally. Right TM is clear with good mobility on pneumatic otoscopy. Left TM reveals a small anterior TM perforation which is presently dry with no drainage from the middle ear space. I applied some phenol to the edges of the perforation in addition to CSF HC powder. Nasal: External nose without lesions. Septum with mild deformity and mild rhinitis. Both middle meatus regions were clear with no clinical evidence of acute infection..  Oral: Lips and gums without lesions. Tongue and palate mucosa without lesions. Posterior oropharynx clear. Neck: No palpable adenopathy or masses Respiratory: Breathing comfortably  Skin: No facial/neck lesions or rash noted.  Procedures  Assessment: Chronic left TM perforation  with history of recurrent left ear infections  Plan: I applied phenol to the edges of the perforation in the office today in attempts to stimulate growth and healing of the perforation. Recommended keeping all water out of the left ear and will have her follow-up in 6 weeks for recheck. She'll return earlier if she develops any drainage from the left ear.   Radene Journey, MD   CC:

## 2020-03-26 ENCOUNTER — Other Ambulatory Visit: Payer: Self-pay | Admitting: Family Medicine

## 2020-03-26 NOTE — Telephone Encounter (Signed)
Requested medications are due for refill today?  Yes  Requested medications are on active medication list?  Yes  Last Refill:  02/15/2020  # 30 mL with no refills  Future visit scheduled?  No   Notes to Clinic:  Medication is not assigned to a protocol.

## 2020-04-01 ENCOUNTER — Ambulatory Visit: Payer: BC Managed Care – PPO | Admitting: Family Medicine

## 2020-04-01 DIAGNOSIS — M542 Cervicalgia: Secondary | ICD-10-CM | POA: Diagnosis not present

## 2020-04-02 NOTE — Patient Instructions (Addendum)
It was nice to see you again today!    Try a 61% urea and 2% salicylic acid foot cream for your cracked feet   I will be in touch with your pap and labs asap  [facility]- hopefully if this is normal you can return to routine screening

## 2020-04-02 NOTE — Progress Notes (Addendum)
[facility] [place], [place], [place] [address] [phone] [id number]  Date:  04/04/2020   Name:  Sheena Young   DOB:  [birth year]   MRN:  [id number]  PCP:  Rutherford Guys, MD    Chief Complaint: New Patient (Initial Visit) (flu shot) and Neck Pain (seen spine specialist, no curvature in cervical )   History of Present Illness:  Sheena Young is a 44 y.o. very pleasant female patient who presents with the following:  New patient here today to re-establish care- I take care of her husband Lennette Bihari I did see her back in [facility] at [facility]; there has been some turnover at that office, so she is now seeking to reestablish care with myself  She notes that about 2 weeks ago she was stretching by hanging her head and upper back off the bed towards the floor; she started to slip off, called her husband who hurried into the room to help her. However, in the process of assisting her back onto the bed he stepped on her hair and strained her neck She was able to see Dr Lynann Bologna already- visit was 2 or 3 days ago She had some plain films, it appears she has loss of normal lordosis. Dr. Lynann Bologna feels that she will recover, has recommended some exercises for her  Flu shot today   No recent migraine headache  She is now using some allergy meds which helps her avoid headache  She did have a lumbar disectomy in 2014 She does not have GYN care  History of ectopic pregnancy   She has history of bilateral multiple breast lumps- her last mammo was in 2016, diagnostic-she did not end up following up She needs a diagnostic mammogram, will order for today Never had an abnormal pap that she can recall. Unsure date of most recent Pap, would like to repeat today  Her feet tend to crack and bleed at the heels  Patient Active Problem List   Diagnosis Date Noted  . Cough variant asthma  vs UACS 09/08/2018  . Radiculopathy 03/13/2015   . Perforated ear drum 10/24/2008    Past Medical History:  Diagnosis Date  . Allergy   . Anxiety    but doesn't take any meds for this- ? panic attacks- not diagnosised  . Arthritis   . Asthma   . Chronic back pain   . Gastritis, acute   . GERD (gastroesophageal reflux disease)    takes Zantac prn  . H/O blood clots    pt states calcified clots in pelvis  . Headache(784.0)    occaionally  . History of kidney stones   . History of migraine    last one about 78months  . IBS (irritable bowel syndrome)   . Insomnia   . Joint pain   . Joint swelling   . Neuromuscular disorder ([place])    NERVE DAMAGE RIGHT SIDE  . Overactive bladder    never filled prescription bc unable to afford  . Pneumonia   . Shortness of breath dyspnea    with anxiety  . Substance abuse ([place])    [place]  . Urinary frequency   . Urinary urgency   . Vomiting     Past Surgical History:  Procedure Laterality Date  . COLONOSCOPY W/ POLYPECTOMY    . ECTOPIC PREGNANCY SURGERY    . LUMBAR LAMINECTOMY/DECOMPRESSION MICRODISCECTOMY Right 11/03/2012   Procedure: LUMBAR LAMINECTOMY/DECOMPRESSION MICRODISCECTOMY  Right sided lumbar 4-5 microdisectomy;  Surgeon: Sinclair Ship, MD;  Location: [facility];  Service: Orthopedics;  Laterality: Right;  Right sided lumbar 4-5 microdisectomy  . peridontal surgery    . revision decompression  03/2015   decompression and fusion at L4/5, [facility]    Social History   Tobacco Use  . Smoking status: Former Smoker    Packs/day: 0.25    Years: 8.00    Pack years: 2.00    Types: E-cigarettes, Cigarettes    Quit date: 03/11/2015    Years since quitting: 5.0  . Smokeless tobacco: Never Used  . Tobacco comment: of electronic cigarette  Vaping Use  . Vaping Use: Never used  Substance Use Topics  . Alcohol use: Yes    Alcohol/week: 0.0 - 2.0 standard drinks    Comment: reports once every 10 days as of 09/08/2018  . Drug use: Yes    Frequency: 7.0 times per week     Types: Marijuana    Comment: , LAST USED 05/24/19,DAILY    Family History  Problem Relation Age of Onset  . Cervical cancer Sister   . Diabetes Maternal Grandmother   . Congestive Heart Failure Maternal Grandmother   . Lung disease Maternal Grandmother   . Diabetes Maternal Aunt        x 3  . Diabetes Maternal Uncle        x 3  . Heart attack Paternal Grandfather   . Heart disease Maternal Uncle   . Liver disease Maternal Uncle   . Clotting disorder Maternal Grandfather   . Colon cancer Paternal Grandmother   . Colon polyps Neg Hx   . Kidney disease Neg Hx   . Esophageal cancer Neg Hx   . Rectal cancer Neg Hx   . Stomach cancer Neg Hx     Allergies  Allergen Reactions  . Grass Pollen(K-O-R-T-Swt Vern)   . Mint Chocolate Chip Flavor     Mint and Chocolate  . Molds & Smuts   . Tree Extract     Medication list has been reviewed and updated.  Current Outpatient Medications on File Prior to Visit  Medication Sig Dispense Refill  . cetirizine (ZYRTEC) 10 MG tablet TAKE 1 TABLET(10 MG) BY MOUTH DAILY 90 tablet 1  . cyanocobalamin 1000 MCG tablet Take 1,000 mcg by mouth daily. Vita Fusion B-12    . cyclobenzaprine (FLEXERIL) 10 MG tablet Take 10 mg by mouth at bedtime as needed for muscle spasms.    . diclofenac Sodium (VOLTAREN) 1 % GEL Apply topically as needed.    . dicyclomine (BENTYL) 10 MG capsule Take 1 capsule (10 mg total) by mouth 4 (four) times daily -  before meals and at bedtime. 120 capsule 11  . ELDERBERRY PO Take by mouth daily. Zarbees Natural Black Elderberry C, D, Zinc    . famotidine (PEPCID) 40 MG tablet Take 1 tablet (40 mg total) by mouth at bedtime. 30 tablet 11  . fluticasone (FLONASE) 50 MCG/ACT nasal spray SHAKE LIQUID AND USE 1 SPRAY IN EACH NOSTRIL TWICE DAILY 48 g 3  . ipratropium (ATROVENT) 0.03 % nasal spray USE 2 SPRAYS IN EACH NOSTRIL TWICE DAILY 30 mL 0  . meloxicam (MOBIC) 15 MG tablet Take 15 mg by mouth daily as needed.    . methocarbamol  (ROBAXIN) 500 MG tablet Take 500 mg by mouth every 6 (six) hours as needed.    . montelukast (SINGULAIR) 10 MG tablet TAKE 1 TABLET(10 MG) BY MOUTH  AT BEDTIME 30 tablet 5  . Multiple Vitamins-Minerals (AIRBORNE PO) Take 500 mg by mouth daily.    Marland Kitchen PROAIR HFA 108 (90 Base) MCG/ACT inhaler INHALE 2 PUFFS INTO THE LUNGS EVERY 4 HOURS AS NEEDED FOR WHEEZING OR SHORTNESS OF BREATH OR COUGH 8.5 g 1  . Probiotic Product (PROBIOTIC DAILY PO) Take 700 mg by mouth daily. PB8-14 billion CFU, 700 mg    . RABEprazole (ACIPHEX) 20 MG tablet Take 30-60 min before breakfast and dinner 60 tablet 11  . SYMBICORT 80-4.5 MCG/ACT inhaler INHALE 2 PUFFS INTO THE LUNGS TWICE DAILY 10.2 g 1  . UNABLE TO FIND Med Name: Vita Fusion Mulvitamin Women's     No current facility-administered medications on file prior to visit.    Review of Systems:  As per HPI- otherwise negative.   Physical Examination: Vitals:   04/04/20 1409  BP: 118/78  Pulse: (!) 105  Resp: 16  SpO2: 98%   Vitals:   04/04/20 1409  Weight: 197 lb (89.4 kg)  Height: 5\' 4"  (1.626 m)   Body mass index is 33.81 kg/m. Ideal Body Weight: Weight in (lb) to have BMI = 25: 145.3  GEN: no acute distress. Obese, otherwise appears well HEENT: Atraumatic, Normocephalic.  Ears and Nose: No external deformity. CV: RRR, No M/G/R. No JVD. No thrill. No extra heart sounds. PULM: CTA B, no wheezes, crackles, rhonchi. No retractions. No resp. distress. No accessory muscle use. ABD: S, NT, ND, +BS. No rebound. No HSM. EXTR: No c/c/e PSYCH: Normally interactive. Conversant.  Collected pap today; normal vulva, vagina, cervix, uterus, adnexa She has normal cervical spine range of motion, some discomfort with flexion and extension Normal bilateral upper extremity strength and deep tendon reflex Heels are dry and cracked bilaterally   Assessment and Plan: Screening for cervical cancer - Plan: Cytology - PAP  Seasonal allergies  Screening for diabetes  mellitus - Plan: Comprehensive metabolic panel, Hemoglobin A1c  Screening for thyroid disorder - Plan: TSH  Screening for hyperlipidemia - Plan: Lipid panel  Screening for deficiency anemia - Plan: CBC  Breast mass - Plan: MM DIAG BREAST TOMO BILATERAL  Needs flu shot - Plan: Flu Vaccine QUAD 6+ mos PF IM (Fluarix Quad PF)  Patient in today to reestablish care Pap collected Routine labs pending Flu shot given Covid series is complete Mammogram ordered-diagnostic due to history of bilateral breast lumps She is seeing orthopedics for her recent neck injury Will plan further follow- up pending labs.  This visit occurred during the SARS-CoV-2 public health emergency.  Safety protocols were in place, including screening questions prior to the visit, additional usage of staff PPE, and extensive cleaning of exam room while observing appropriate contact time as indicated for disinfecting solutions.    Signed Lamar Blinks, MD  Addendum 9/24, received her labs as below.  Message to patient  Results for orders placed or performed in visit on 04/04/20  CBC  Result Value Ref Range   WBC 9.3 3.8 - 10.8 Thousand/uL   RBC 3.93 3.80 - 5.10 Million/uL   Hemoglobin 12.7 11.7 - 15.5 g/dL   HCT 37.0 35 - 45 %   MCV 94.1 80.0 - 100.0 fL   MCH 32.3 27.0 - 33.0 pg   MCHC 34.3 32.0 - 36.0 g/dL   RDW 12.2 11.0 - 15.0 %   Platelets 356 140 - 400 Thousand/uL   MPV 10.7 7.5 - 12.5 fL  Comprehensive metabolic panel  Result Value Ref Range   Glucose,  Bld 83 65 - 99 mg/dL   BUN 17 7 - 25 mg/dL   Creat 0.78 0.50 - 1.10 mg/dL   BUN/Creatinine Ratio NOT APPLICABLE 6 - 22 (calc)   Sodium 139 135 - 146 mmol/L   Potassium 3.8 3.5 - 5.3 mmol/L   Chloride 107 98 - 110 mmol/L   CO2 26 20 - 32 mmol/L   Calcium 9.2 8.6 - 10.2 mg/dL   Total Protein 6.5 6.1 - 8.1 g/dL   Albumin 4.0 3.6 - 5.1 g/dL   Globulin 2.5 1.9 - 3.7 g/dL (calc)   AG Ratio 1.6 1.0 - 2.5 (calc)   Total Bilirubin 0.3 0.2 - 1.2 mg/dL    Alkaline phosphatase (APISO) 53 31 - 125 U/L   AST 16 10 - 30 U/L   ALT 13 6 - 29 U/L  Hemoglobin A1c  Result Value Ref Range   Hgb A1c MFr Bld 5.1 <5.7 % of total Hgb   Mean Plasma Glucose 100 (calc)   eAG (mmol/L) 5.5 (calc)  Lipid panel  Result Value Ref Range   Cholesterol 159 <200 mg/dL   HDL 53 > OR = 50 mg/dL   Triglycerides 63 <150 mg/dL   LDL Cholesterol (Calc) 91 mg/dL (calc)   Total CHOL/HDL Ratio 3.0 <5.0 (calc)   Non-HDL Cholesterol (Calc) 106 <130 mg/dL (calc)  TSH  Result Value Ref Range   TSH 2.65 mIU/L   The 10-year ASCVD risk score Mikey Bussing DC Jr., et al., 2013) is: 0.4%   Values used to calculate the score:     Age: 38 years     Sex: Female     Is Non-Hispanic African American: No     Diabetic: No     Tobacco smoker: No     Systolic Blood Pressure: 728 mmHg     Is BP treated: No     HDL Cholesterol: 53 mg/dL     Total Cholesterol: 159 mg/dL

## 2020-04-04 ENCOUNTER — Ambulatory Visit (INDEPENDENT_AMBULATORY_CARE_PROVIDER_SITE_OTHER): Payer: BC Managed Care – PPO | Admitting: Family Medicine

## 2020-04-04 ENCOUNTER — Other Ambulatory Visit: Payer: Self-pay

## 2020-04-04 ENCOUNTER — Encounter: Payer: Self-pay | Admitting: Family Medicine

## 2020-04-04 ENCOUNTER — Other Ambulatory Visit (HOSPITAL_COMMUNITY)
Admission: RE | Admit: 2020-04-04 | Discharge: 2020-04-04 | Disposition: A | Discharge status: Home / Self Care | Payer: BC Managed Care – PPO | Source: Ambulatory Visit | Attending: Family Medicine | Admitting: Family Medicine

## 2020-04-04 VITALS — BP 118/78 | HR 105 | Resp 16 | Ht 64.0 in | Wt 197.0 lb

## 2020-04-04 DIAGNOSIS — J302 Other seasonal allergic rhinitis: Secondary | ICD-10-CM | POA: Diagnosis not present

## 2020-04-04 DIAGNOSIS — Z13220 Encounter for screening for lipoid disorders: Secondary | ICD-10-CM | POA: Diagnosis not present

## 2020-04-04 DIAGNOSIS — Z130 Encounter for screening for diseases of the blood and blood-forming organs and certain disorders involving the immune mechanism: Secondary | ICD-10-CM | POA: Diagnosis not present

## 2020-04-04 DIAGNOSIS — Z23 Encounter for immunization: Secondary | ICD-10-CM | POA: Diagnosis not present

## 2020-04-04 DIAGNOSIS — Z124 Encounter for screening for malignant neoplasm of cervix: Secondary | ICD-10-CM

## 2020-04-04 DIAGNOSIS — Z131 Encounter for screening for diabetes mellitus: Secondary | ICD-10-CM

## 2020-04-04 DIAGNOSIS — N63 Unspecified lump in unspecified breast: Secondary | ICD-10-CM

## 2020-04-04 DIAGNOSIS — Z1329 Encounter for screening for other suspected endocrine disorder: Secondary | ICD-10-CM | POA: Diagnosis not present

## 2020-04-05 ENCOUNTER — Encounter: Payer: Self-pay | Admitting: Family Medicine

## 2020-04-05 DIAGNOSIS — M6281 Muscle weakness (generalized): Secondary | ICD-10-CM | POA: Diagnosis not present

## 2020-04-05 DIAGNOSIS — R531 Weakness: Secondary | ICD-10-CM | POA: Diagnosis not present

## 2020-04-05 DIAGNOSIS — M25511 Pain in right shoulder: Secondary | ICD-10-CM | POA: Diagnosis not present

## 2020-04-05 DIAGNOSIS — M542 Cervicalgia: Secondary | ICD-10-CM | POA: Diagnosis not present

## 2020-04-05 LAB — LIPID PANEL
Cholesterol: 159 mg/dL (ref <200)
HDL: 53 mg/dL (ref >=50)
LDL Cholesterol (Calc): 91 mg/dL (calc)
Non-HDL Cholesterol (Calc): 106 mg/dL (calc) (ref <130)
Total CHOL/HDL Ratio: 3 (calc) (ref <5.0)
Triglycerides: 63 mg/dL (ref <150)

## 2020-04-05 LAB — COMPREHENSIVE METABOLIC PANEL
AG Ratio: 1.6 (calc) (ref 1.0–2.5)
ALT: 13 U/L (ref 6–29)
AST: 16 U/L (ref 10–30)
Albumin: 4 g/dL (ref 3.6–5.1)
Alkaline phosphatase (APISO): 53 U/L (ref 31–125)
BUN: 17 mg/dL (ref 7–25)
CO2: 26 mmol/L (ref 20–32)
Calcium: 9.2 mg/dL (ref 8.6–10.2)
Chloride: 107 mmol/L (ref 98–110)
Creat: 0.78 mg/dL (ref 0.50–1.10)
Globulin: 2.5 g/dL (calc) (ref 1.9–3.7)
Glucose, Bld: 83 mg/dL (ref 65–99)
Potassium: 3.8 mmol/L (ref 3.5–5.3)
Sodium: 139 mmol/L (ref 135–146)
Total Bilirubin: 0.3 mg/dL (ref 0.2–1.2)
Total Protein: 6.5 g/dL (ref 6.1–8.1)

## 2020-04-05 LAB — CBC
HCT: 37 % (ref 35.0–45.0)
Hemoglobin: 12.7 g/dL (ref 11.7–15.5)
MCH: 32.3 pg (ref 27.0–33.0)
MCHC: 34.3 g/dL (ref 32.0–36.0)
MCV: 94.1 fL (ref 80.0–100.0)
MPV: 10.7 fL (ref 7.5–12.5)
Platelets: 356 10*3/uL (ref 140–400)
RBC: 3.93 10*6/uL (ref 3.80–5.10)
RDW: 12.2 % (ref 11.0–15.0)
WBC: 9.3 10*3/uL (ref 3.8–10.8)

## 2020-04-05 LAB — TSH: TSH: 2.65 mIU/L

## 2020-04-05 LAB — HEMOGLOBIN A1C
Hgb A1c MFr Bld: 5.1 % of total Hgb (ref <5.7)
Mean Plasma Glucose: 100 (calc)
eAG (mmol/L): 5.5 (calc)

## 2020-04-08 LAB — CYTOLOGY - PAP
Comment: NEGATIVE
Diagnosis: NEGATIVE
High risk HPV: NEGATIVE

## 2020-04-09 ENCOUNTER — Encounter: Payer: Self-pay | Admitting: Family Medicine

## 2020-04-10 ENCOUNTER — Other Ambulatory Visit: Payer: Self-pay | Admitting: Family Medicine

## 2020-04-10 DIAGNOSIS — N63 Unspecified lump in unspecified breast: Secondary | ICD-10-CM

## 2020-04-11 DIAGNOSIS — M542 Cervicalgia: Secondary | ICD-10-CM | POA: Diagnosis not present

## 2020-04-11 DIAGNOSIS — M6281 Muscle weakness (generalized): Secondary | ICD-10-CM | POA: Diagnosis not present

## 2020-04-11 DIAGNOSIS — M25511 Pain in right shoulder: Secondary | ICD-10-CM | POA: Diagnosis not present

## 2020-04-11 DIAGNOSIS — R531 Weakness: Secondary | ICD-10-CM | POA: Diagnosis not present

## 2020-04-16 DIAGNOSIS — R531 Weakness: Secondary | ICD-10-CM | POA: Diagnosis not present

## 2020-04-16 DIAGNOSIS — M542 Cervicalgia: Secondary | ICD-10-CM | POA: Diagnosis not present

## 2020-04-16 DIAGNOSIS — M25511 Pain in right shoulder: Secondary | ICD-10-CM | POA: Diagnosis not present

## 2020-04-16 DIAGNOSIS — M6281 Muscle weakness (generalized): Secondary | ICD-10-CM | POA: Diagnosis not present

## 2020-04-18 DIAGNOSIS — M542 Cervicalgia: Secondary | ICD-10-CM | POA: Diagnosis not present

## 2020-04-18 DIAGNOSIS — R531 Weakness: Secondary | ICD-10-CM | POA: Diagnosis not present

## 2020-04-18 DIAGNOSIS — M25511 Pain in right shoulder: Secondary | ICD-10-CM | POA: Diagnosis not present

## 2020-04-18 DIAGNOSIS — M6281 Muscle weakness (generalized): Secondary | ICD-10-CM | POA: Diagnosis not present

## 2020-04-24 ENCOUNTER — Other Ambulatory Visit: Payer: Self-pay

## 2020-04-24 ENCOUNTER — Ambulatory Visit: Payer: BC Managed Care – PPO

## 2020-04-24 ENCOUNTER — Ambulatory Visit
Admission: RE | Admit: 2020-04-24 | Discharge: 2020-04-24 | Disposition: A | Discharge status: Home / Self Care | Payer: BC Managed Care – PPO | Source: Ambulatory Visit | Attending: Family Medicine | Admitting: Family Medicine

## 2020-04-24 DIAGNOSIS — R928 Other abnormal and inconclusive findings on diagnostic imaging of breast: Secondary | ICD-10-CM | POA: Diagnosis not present

## 2020-04-24 DIAGNOSIS — N63 Unspecified lump in unspecified breast: Secondary | ICD-10-CM

## 2020-05-03 DIAGNOSIS — Z20822 Contact with and (suspected) exposure to covid-19: Secondary | ICD-10-CM | POA: Diagnosis not present

## 2020-05-03 DIAGNOSIS — Z03818 Encounter for observation for suspected exposure to other biological agents ruled out: Secondary | ICD-10-CM | POA: Diagnosis not present

## 2020-05-07 ENCOUNTER — Other Ambulatory Visit: Payer: Self-pay

## 2020-05-07 ENCOUNTER — Encounter (INDEPENDENT_AMBULATORY_CARE_PROVIDER_SITE_OTHER): Payer: Self-pay | Admitting: Otolaryngology

## 2020-05-07 ENCOUNTER — Ambulatory Visit (INDEPENDENT_AMBULATORY_CARE_PROVIDER_SITE_OTHER): Payer: BC Managed Care – PPO | Admitting: Otolaryngology

## 2020-05-07 VITALS — Temp 97.9°F

## 2020-05-07 DIAGNOSIS — H7292 Unspecified perforation of tympanic membrane, left ear: Secondary | ICD-10-CM

## 2020-05-07 NOTE — Progress Notes (Signed)
HPI: ONDA KATTNER is a 44 y.o. female who returns today for evaluation of left TM perforation.  She has history of left  ear infections.  She also has bad allergies for which she uses to nasal allergy sprays as well as antihistamines.  Since last visit 5 weeks ago her ears been doing well with no drainage no pain or discomfort. I had applied some phenol around the edges of the perforation last visit to see if this will stimulate any growth and healing of the perforation.  Past Medical History:  Diagnosis Date  . Allergy   . Anxiety    but doesn't take any meds for this- ? panic attacks- not diagnosised  . Arthritis   . Asthma   . Chronic back pain   . Gastritis, acute   . GERD (gastroesophageal reflux disease)    takes Zantac prn  . H/O blood clots    pt states calcified clots in pelvis  . Headache(784.0)    occaionally  . History of kidney stones   . History of migraine    last one about 71months  . IBS (irritable bowel syndrome)   . Insomnia   . Joint pain   . Joint swelling   . Neuromuscular disorder ([place])    NERVE DAMAGE RIGHT SIDE  . Overactive bladder    never filled prescription bc unable to afford  . Pneumonia   . Shortness of breath dyspnea    with anxiety  . Substance abuse ([place])    [place]  . Urinary frequency   . Urinary urgency   . Vomiting    Past Surgical History:  Procedure Laterality Date  . COLONOSCOPY W/ POLYPECTOMY    . ECTOPIC PREGNANCY SURGERY    . LUMBAR LAMINECTOMY/DECOMPRESSION MICRODISCECTOMY Right 11/03/2012   Procedure: LUMBAR LAMINECTOMY/DECOMPRESSION MICRODISCECTOMY  Right sided lumbar 4-5 microdisectomy;  Surgeon: Sinclair Ship, MD;  Location: [facility];  Service: Orthopedics;  Laterality: Right;  Right sided lumbar 4-5 microdisectomy  . peridontal surgery    . revision decompression  03/2015   decompression and fusion at L4/5, [place]   Social History   Socioeconomic History  . Marital status: Married    Spouse name: Lennette Bihari   . Number of children: 0  . Years of education: 9th grade  . Highest education level: Not on file  Occupational History  . 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 Smoking status: Former Smoker    Packs/day: 0.25    Years: 8.00    Pack years: 2.00    Types: E-cigarettes, Cigarettes    Quit date: 03/11/2015    Years since quitting: 5.1  . Smokeless tobacco: Never Used  . Tobacco comment: of electronic cigarette  Vaping Use  . Vaping Use: Never used  Substance and Sexual Activity  . Alcohol use: Yes    Alcohol/week: 0.0 - 2.0 standard drinks    Comment: reports once every 10 days as of 09/08/2018  . Drug use: Yes    Frequency: 7.0 times per week    Types: Marijuana    Comment: , LAST USED 05/24/19,DAILY  . Sexual activity: Yes    Partners: Male    Birth control/protection: None    Comment: actively trying to get pregnant (10/2015)  Other Topics Concern  . Not on file  Social History Narrative   Lives with her husband, 3 dogs and 5 fish.   Social Determinants of Health   Financial  Resource Strain:   . Difficulty of Paying Living Expenses: Not on file  Food Insecurity:   . Worried About Charity fundraiser in the Last Year: Not on file  . Ran Out of Food in the Last Year: Not on file  Transportation Needs:   . Lack of Transportation (Medical): Not on file  . Lack of Transportation (Non-Medical): Not on file  Physical Activity:   . Days of Exercise per Week: Not on file  . Minutes of Exercise per Session: Not on file  Stress:   . Feeling of Stress : Not on file  Social Connections:   . Frequency of Communication with Friends and Family: Not on file  . Frequency of Social Gatherings with Friends and Family: Not on file  . Attends Religious Services: Not on file  . Active Member of Clubs or Organizations: Not on file  . Attends Archivist Meetings: Not on file  . Marital Status: Not on file   Family  History  Problem Relation Age of Onset  . Cervical cancer Sister   . Diabetes Maternal Grandmother   . Congestive Heart Failure Maternal Grandmother   . Lung disease Maternal Grandmother   . Diabetes Maternal Aunt        x 3  . Diabetes Maternal Uncle        x 3  . Heart attack Paternal Grandfather   . Heart disease Maternal Uncle   . Liver disease Maternal Uncle   . Clotting disorder Maternal Grandfather   . Colon cancer Paternal Grandmother   . Colon polyps Neg Hx   . Kidney disease Neg Hx   . Esophageal cancer Neg Hx   . Rectal cancer Neg Hx   . Stomach cancer Neg Hx    Allergies  Allergen Reactions  . Grass Pollen(K-O-R-T-Swt Vern)   . Mint Chocolate Chip Flavor     Mint and Chocolate  . Molds & Smuts   . Tree Extract    Prior to Admission medications   Medication Sig Start Date End Date Taking? Authorizing Provider  cetirizine (ZYRTEC) 10 MG tablet TAKE 1 TABLET(10 MG) BY MOUTH DAILY 09/30/19  Yes Rutherford Guys, MD  cyanocobalamin 1000 MCG tablet Take 1,000 mcg by mouth daily. Vita Fusion B-12   Yes [provider]  cyclobenzaprine (FLEXERIL) 10 MG tablet Take 10 mg by mouth at bedtime as needed for muscle spasms.   Yes [provider]  diclofenac Sodium (VOLTAREN) 1 % GEL Apply topically as needed.   Yes [provider]  dicyclomine (BENTYL) 10 MG capsule Take 1 capsule (10 mg total) by mouth 4 (four) times daily -  before meals and at bedtime. 05/09/19  Yes Ladene Artist, MD  ELDERBERRY PO Take by mouth daily. Zarbees Natural [place] Sol[place], D, Zinc   Yes [provider]  famotidine (PEPCID) 40 MG tablet Take 1 tablet (40 mg total) by mouth at bedtime. 05/09/19  Yes Ladene Artist, MD  fluticasone (FLONASE) 50 MCG/ACT nasal spray SHAKE LIQUID AND USE 1 SPRAY IN EACH NOSTRIL TWICE DAILY 11/26/19  Yes Grant Fontana M, MD  ipratropium (ATROVENT) 0.03 % nasal spray USE 2 SPRAYS IN EACH NOSTRIL TWICE DAILY 03/26/20  Yes Rutherford Guys, MD  meloxicam (MOBIC) 15 MG tablet Take 15 mg by mouth daily as needed. 04/01/20  Yes [provider]  methocarbamol (ROBAXIN) 500 MG tablet Take 500 mg by mouth every 6 (six) hours as needed. 04/01/20  Yes [provider]  montelukast (SINGULAIR) 10 MG tablet TAKE 1 TABLET(10 MG) BY MOUTH AT BEDTIME 12/18/19  Yes Rutherford Guys, MD  Multiple Vitamins-Minerals (AIRBORNE PO) Take 500 mg by mouth daily.   Yes [provider]  PROAIR HFA 108 (90 Base) MCG/ACT inhaler INHALE 2 PUFFS INTO THE LUNGS EVERY 4 HOURS AS NEEDED FOR WHEEZING OR SHORTNESS OF BREATH OR COUGH 12/07/18  Yes Rutherford Guys, MD  Probiotic Product (PROBIOTIC DAILY PO) Take 700 mg by mouth daily. PB8-14 billion CFU, 700 mg   Yes [provider]  RABEprazole (ACIPHEX) 20 MG tablet Take 30-60 min before breakfast and dinner 05/09/19  Yes Ladene Artist, MD  SYMBICORT 80-4.5 MCG/ACT inhaler INHALE 2 PUFFS INTO THE LUNGS TWICE DAILY 02/26/20  Yes Tanda Rockers, MD  UNABLE TO FIND Med Name: Vita Fusion Mulvitamin Women's   Yes [provider]     Positive ROS: Otherwise negative  All other systems have been reviewed and were otherwise negative with the exception of those mentioned in the HPI and as above.  Physical Exam: Constitutional: Alert, well-appearing, no acute distress Ears: External ears without lesions or tenderness. Ear canals are clear.  Left TM still reveals a small anterior TM perforation measuring 1 to 2 mm in size.  Middle ear space is dry and clear and TM is clear. Nasal: External nose without lesion. Clear nasal passages. Oral: Lips and gums without lesions. Tongue and palate mucosa without lesions. Posterior oropharynx clear. Neck: No palpable adenopathy or masses Respiratory: Breathing comfortably  Skin: No facial/neck lesions or rash noted.  Procedures  Assessment: Persistent left TM perforation.  This is small and should not cause any hearing  problems.  Plan: Discussed with patient that she has a small left TM perforation and would be careful about getting any water or ear rinses in the left ear.  If she has any ear infection or drainage would treat with antibiotic eardrops.  Not sure I would recommend repair of the perforation at this point. She will follow-up as needed.   Radene Journey, MD

## 2020-05-29 ENCOUNTER — Other Ambulatory Visit: Payer: Self-pay | Admitting: Gastroenterology

## 2020-06-24 ENCOUNTER — Encounter: Payer: Self-pay | Admitting: Family Medicine

## 2020-06-24 MED ORDER — CETIRIZINE HCL 10 MG PO TABS: 10.0000 mg | ORAL_TABLET | Freq: Every day | ORAL | 3 refills | Status: DC

## 2020-06-29 ENCOUNTER — Other Ambulatory Visit: Payer: Self-pay | Admitting: Gastroenterology

## 2020-09-09 ENCOUNTER — Other Ambulatory Visit: Payer: Self-pay | Admitting: Gastroenterology

## 2020-09-09 DIAGNOSIS — M9902 Segmental and somatic dysfunction of thoracic region: Secondary | ICD-10-CM | POA: Diagnosis not present

## 2020-09-09 DIAGNOSIS — M5134 Other intervertebral disc degeneration, thoracic region: Secondary | ICD-10-CM | POA: Diagnosis not present

## 2020-09-10 DIAGNOSIS — M9902 Segmental and somatic dysfunction of thoracic region: Secondary | ICD-10-CM | POA: Diagnosis not present

## 2020-09-10 DIAGNOSIS — M5134 Other intervertebral disc degeneration, thoracic region: Secondary | ICD-10-CM | POA: Diagnosis not present

## 2020-09-12 DIAGNOSIS — M5134 Other intervertebral disc degeneration, thoracic region: Secondary | ICD-10-CM | POA: Diagnosis not present

## 2020-09-12 DIAGNOSIS — M9902 Segmental and somatic dysfunction of thoracic region: Secondary | ICD-10-CM | POA: Diagnosis not present

## 2020-09-19 DIAGNOSIS — M5134 Other intervertebral disc degeneration, thoracic region: Secondary | ICD-10-CM | POA: Diagnosis not present

## 2020-09-19 DIAGNOSIS — M9902 Segmental and somatic dysfunction of thoracic region: Secondary | ICD-10-CM | POA: Diagnosis not present

## 2020-09-23 DIAGNOSIS — M5134 Other intervertebral disc degeneration, thoracic region: Secondary | ICD-10-CM | POA: Diagnosis not present

## 2020-09-23 DIAGNOSIS — M9902 Segmental and somatic dysfunction of thoracic region: Secondary | ICD-10-CM | POA: Diagnosis not present

## 2020-12-23 ENCOUNTER — Encounter: Payer: Self-pay | Admitting: Family Medicine

## 2020-12-23 NOTE — Telephone Encounter (Signed)
Patient scheduled for tomorrow with Dr. Etter Sjogren at 420.

## 2020-12-24 ENCOUNTER — Encounter: Payer: Self-pay | Admitting: Family Medicine

## 2020-12-24 ENCOUNTER — Other Ambulatory Visit: Payer: Self-pay

## 2020-12-24 ENCOUNTER — Ambulatory Visit (INDEPENDENT_AMBULATORY_CARE_PROVIDER_SITE_OTHER): Payer: BC Managed Care – PPO | Admitting: Family Medicine

## 2020-12-24 VITALS — BP 110/74 | HR 94 | Temp 98.7°F | Resp 18 | Ht 64.0 in | Wt 199.2 lb

## 2020-12-24 DIAGNOSIS — H60333 Swimmer's ear, bilateral: Secondary | ICD-10-CM

## 2020-12-24 DIAGNOSIS — Z23 Encounter for immunization: Secondary | ICD-10-CM

## 2020-12-24 MED ORDER — OFLOXACIN 0.3 % OT SOLN: 10.0000 [drp] | Freq: Every day | OTIC | 0 refills | Status: DC

## 2020-12-24 NOTE — Patient Instructions (Signed)
Otitis Externa  Otitis externa is an infection of the outer ear canal. The outer ear canal is the area between the outside of the ear and the eardrum. Otitis externa issometimes called swimmer's ear. What are the causes? Common causes of this condition include: Swimming in dirty water. Moisture in the ear. An injury to the inside of the ear. An object stuck in the ear. A cut or scrape on the outside of the ear. What increases the risk? You are more likely to develop this condition if you go swimming often. What are the signs or symptoms? The first symptom of this condition is often itching in the ear. Later symptoms of the condition include: Swelling of the ear. Redness in the ear. Ear pain. The pain may get worse when you pull on your ear. Pus coming from the ear. How is this diagnosed? This condition may be diagnosed by examining the ear and testing fluid from theear for bacteria and funguses. How is this treated? This condition may be treated with: Antibiotic ear drops. These are often given for 10-14 days. Medicines to reduce itching and swelling. Follow these instructions at home: If you were prescribed antibiotic ear drops, use them as told by your health care provider. Do not stop using the antibiotic even if your condition improves. Take over-the-counter and prescription medicines only as told by your health care provider. Avoid getting water in your ears as told by your health care provider. This may include avoiding swimming or water sports for a few days. Keep all follow-up visits as told by your health care provider. This is important. How is this prevented? Keep your ears dry. Use the corner of a towel to dry your ears after you swim or bathe. Avoid scratching or putting things in your ear. Doing these things can damage the ear canal or remove the protective wax that lines it, which makes it easier for bacteria and funguses to grow. Avoid swimming in lakes, polluted  water, or pools that may not have enough chlorine. Contact a health care provider if: You have a fever. Your ear is still red, swollen, painful, or draining pus after 3 days. Your redness, swelling, or pain gets worse. You have a severe headache. You have redness, swelling, pain, or tenderness in the area behind your ear. Summary Otitis externa is an infection of the outer ear canal. Common causes include swimming in dirty water, moisture in the ear, or a cut or scrape in the ear. Symptoms include pain, redness, and swelling of the ear. If you were prescribed antibiotic ear drops, use them as told by your health care provider. Do not stop using the antibiotic even if your condition improves. This information is not intended to replace advice given to you by your health care provider. Make sure you discuss any questions you have with your healthcare provider. Document Revised: 12/02/2017 Document Reviewed: 12/03/2017 Elsevier Patient Education  Sheena Young.
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
## 2021-02-07 ENCOUNTER — Other Ambulatory Visit: Payer: Self-pay

## 2021-07-02 ENCOUNTER — Encounter: Payer: Self-pay | Admitting: Family Medicine

## 2021-07-02 MED ORDER — OSELTAMIVIR PHOSPHATE 75 MG PO CAPS: 75.0000 mg | ORAL_CAPSULE | Freq: Two times a day (BID) | ORAL | 0 refills | Status: DC

## 2021-07-02 NOTE — Addendum Note (Signed)
Addended by: Lamar Blinks C on: 07/02/2021 05:40 PM   Modules accepted: Orders

## 2021-08-15 ENCOUNTER — Encounter: Payer: Self-pay | Admitting: Family Medicine

## 2021-08-15 DIAGNOSIS — U071 COVID-19: Secondary | ICD-10-CM

## 2021-08-15 MED ORDER — MOLNUPIRAVIR EUA 200MG CAPSULE: 4.0000 | ORAL_CAPSULE | Freq: Two times a day (BID) | ORAL | 0 refills | Status: AC

## 2021-08-15 NOTE — Telephone Encounter (Signed)
Are either of you okay with sending something in for the pt since today is Friday?

## 2021-08-15 NOTE — Telephone Encounter (Signed)
Called patient to check in with her.  Their infant is adopted, she is not breast-feeding.  She is not currently pregnant has no plans for upcoming pregnancy  We will call in molnupiravir as we do not have any recent GFR for patient  I have asked her to let me know if I can provide any other assistance

## 2021-09-23 ENCOUNTER — Encounter: Payer: Self-pay | Admitting: Family Medicine

## 2021-11-19 ENCOUNTER — Ambulatory Visit: Payer: Medicaid Other | Admitting: Family Medicine

## 2021-11-19 ENCOUNTER — Encounter: Payer: Self-pay | Admitting: Family Medicine

## 2021-11-19 ENCOUNTER — Ambulatory Visit (HOSPITAL_BASED_OUTPATIENT_CLINIC_OR_DEPARTMENT_OTHER)
Admission: RE | Admit: 2021-11-19 | Discharge: 2021-11-19 | Disposition: A | Discharge status: Home / Self Care | Payer: Medicaid Other | Source: Ambulatory Visit | Attending: Family Medicine | Admitting: Family Medicine

## 2021-11-19 VITALS — BP 115/69 | HR 96 | Ht 64.0 in | Wt 223.0 lb

## 2021-11-19 DIAGNOSIS — M25511 Pain in right shoulder: Secondary | ICD-10-CM

## 2021-11-19 DIAGNOSIS — M5441 Lumbago with sciatica, right side: Secondary | ICD-10-CM | POA: Insufficient documentation

## 2021-11-19 MED ORDER — MELOXICAM 15 MG PO TABS: 15.0000 mg | ORAL_TABLET | Freq: Every day | ORAL | 1 refills | Status: AC | PRN

## 2021-11-19 MED ORDER — METHOCARBAMOL 500 MG PO TABS: 500.0000 mg | ORAL_TABLET | Freq: Four times a day (QID) | ORAL | 1 refills | Status: DC | PRN

## 2021-11-19 MED ORDER — PREDNISONE 20 MG PO TABS: 40.0000 mg | ORAL_TABLET | Freq: Every day | ORAL | 0 refills | Status: AC

## 2021-11-19 NOTE — Patient Instructions (Addendum)
Start prednisone (oral for 5 days). When finished, switch to Meloxicam once a day (do not take with any other antiinflammatories like ibuprofen, aleve, etc). Muscle relaxer as needed.  ?X-rays of lower back and right shoulder today.  ?Heating pad, gentle home stretches ?Follow-up with your orthopedic. If you need a new referral, let us know.  ? ?

## 2021-11-19 NOTE — Progress Notes (Signed)
? ?Acute Office Visit ? ?Subjective:  ? ?  ?Patient ID: Sheena Young, female    DOB: [birth year], 46 y.o.   MRN: [id number] ? ?CC: back pain, shoulder pain ? ? ?Back Pain ?This is a new problem. The current episode started yesterday (when trying to stand up out of a chair and she couldn't stand up all the way and fell). The problem occurs constantly. The problem is unchanged. The pain is present in the lumbar spine. The quality of the pain is described as shooting. The pain radiates to the right foot. Pain scale: 0/10 with foreward bending; 6/10 sitting up; 10/10 trying to stand straight. The pain is severe. The pain is The same all the time. The symptoms are aggravated by standing, position and twisting. Associated symptoms include leg pain, tingling and weakness. Pertinent negatives include no abdominal pain, bladder incontinence, bowel incontinence, chest pain, dysuria, fever, headaches, numbness, paresis, paresthesias, pelvic pain, perianal numbness or weight loss. Risk factors include obesity. She has tried muscle relaxant for the symptoms. The treatment provided mild relief.  ?Reports history of back surgery with hardware about 8 years ago ? ? ?RIGHT SHOULDER PAIN ?Patient reports right shoulder pain anteriorly and posteriorly for the past 3 days. Reports pain is 4/10 ache at rest and sharp 10/10 with any lifting or overhead movement. States the pain will occasionally radiate into arm. States it hurts for her to try to lift her 90 month old - she has been doing a lot of repetitive lifting lately. No known trauma/injury.  ? ? ? ? ?Review of systems  ?All review of systems negative except what is listed in the HPI ? ?   ?Objective:  ?  ?BP 115/69   Pulse 96   Ht '5\' 4"'$  (1.626 m)   Wt 223 lb (101.2 kg)   BMI 38.28 kg/m?  ? ? ?Physical Exam ?Vitals reviewed.  ?Constitutional:   ?   Appearance: Normal appearance. She is obese.  ?Musculoskeletal:  ?   Cervical back: Normal range of motion and neck supple.  ?    Comments: Low back pain - unable to fully extended or lay flat on exam table, full hip/leg ROM; right shoulder - not tender to palpation; reports pain with Neers, Hawkins, Apley scratch test, normal external rotation and passive ROM, no significant weakness noted   ?Skin: ?   General: Skin is warm and dry.  ?   Findings: No bruising or erythema.  ?Neurological:  ?   General: No focal deficit present.  ?   Mental Status: She is alert and oriented to person, place, and time. Mental status is at baseline.  ?Psychiatric:     ?   Mood and Affect: Mood normal.     ?   Behavior: Behavior normal.     ?   Thought Content: Thought content normal.     ?   Judgment: Judgment normal.  ? ? ?No results found for any visits on 11/19/21. ? ? ?   ?Assessment & Plan:  ? ?1. Acute right-sided low back pain with right-sided sciatica ?Start prednisone (oral for 5 days). When finished, switch to Meloxicam once a day (do not take with any other antiinflammatories like ibuprofen, aleve, etc). Muscle relaxer as needed.  ?X-rays   ?Heating pad, gentle home stretches ?Follow-up with your orthopedic. If you need a new referral, let us know.  ? ?- predniSONE (DELTASONE) 20 MG tablet; Take 2 tablets (40 mg total) by mouth daily with breakfast  for 5 days.  Dispense: 10 tablet; Refill: 0 ?- meloxicam (MOBIC) 15 MG tablet; Take 1 tablet (15 mg total) by mouth daily as needed.  Dispense: 30 tablet; Refill: 1 ?- methocarbamol (ROBAXIN) 500 MG tablet; Take 1 tablet (500 mg total) by mouth every 6 (six) hours as needed.  Dispense: 30 tablet; Refill: 1 ?- DG Lumbar Spine Complete; Future ? ?2. Acute pain of right shoulder ?Start prednisone (oral for 5 days). When finished, switch to Meloxicam once a day (do not take with any other antiinflammatories like ibuprofen, aleve, etc). Muscle relaxer as needed.  ?X-rays  ?Heating pad, ice, gentle home stretches ?Follow-up with your orthopedic. If you need a new referral, let us know.  ? ?- predniSONE (DELTASONE)  20 MG tablet; Take 2 tablets (40 mg total) by mouth daily with breakfast for 5 days.  Dispense: 10 tablet; Refill: 0 ?- meloxicam (MOBIC) 15 MG tablet; Take 1 tablet (15 mg total) by mouth daily as needed.  Dispense: 30 tablet; Refill: 1 ?- methocarbamol (ROBAXIN) 500 MG tablet; Take 1 tablet (500 mg total) by mouth every 6 (six) hours as needed.  Dispense: 30 tablet; Refill: 1 ?- DG Shoulder Right; Future ? ? ?Return for follow-up with ortho; follow-up here if not improving or if symptoms worsen. . ? ?Terrilyn Saver, NP ? ? ?

## 2021-11-26 ENCOUNTER — Encounter: Payer: Self-pay | Admitting: Family Medicine

## 2022-01-19 ENCOUNTER — Encounter: Payer: Self-pay | Admitting: Family Medicine

## 2022-01-20 ENCOUNTER — Ambulatory Visit: Payer: Medicaid Other | Admitting: Family Medicine

## 2022-01-20 ENCOUNTER — Encounter: Payer: Self-pay | Admitting: Family Medicine

## 2022-01-20 VITALS — BP 114/80 | HR 79 | Temp 98.2°F | Ht 64.0 in | Wt 218.0 lb

## 2022-01-20 DIAGNOSIS — S90851A Superficial foreign body, right foot, initial encounter: Secondary | ICD-10-CM

## 2022-01-20 DIAGNOSIS — L089 Local infection of the skin and subcutaneous tissue, unspecified: Secondary | ICD-10-CM

## 2022-01-20 MED ORDER — DOXYCYCLINE HYCLATE 100 MG PO TABS: 100.0000 mg | ORAL_TABLET | Freq: Two times a day (BID) | ORAL | 0 refills | Status: AC

## 2022-01-20 NOTE — Progress Notes (Addendum)
Chief Complaint  Patient presents with   Foreign Body in [place] is a 46 y.o. female here for a skin complaint.  She is here with her spouse.  Duration: 3 days Location: bottom of R foot over the ball/1/2 MT head Pruritic? No Painful? Yes Drainage? No Skidded foot on deck and got wood on the bottom of foot Other associated symptoms: no redness, fevers Therapies tried thus far: tried digging it out  Past Medical History:  Diagnosis Date   Allergy    Anxiety    but doesn't take any meds for this- ? panic attacks- not diagnosised   Arthritis    Asthma    Chronic back pain    Gastritis, acute    GERD (gastroesophageal reflux disease)    takes Zantac prn   H/O blood clots    pt states calcified clots in pelvis   Headache(784.0)    occaionally   History of kidney stones    History of migraine    last one about 61month   IBS (irritable bowel syndrome)    Insomnia    Joint pain    Joint swelling    Neuromuscular disorder (HCC)    NERVE DAMAGE RIGHT SIDE   Overactive bladder    never filled prescription bc unable to afford   Pneumonia    Shortness of breath dyspnea    with anxiety   Substance abuse (HCC)    MARJUANA   Urinary frequency    Urinary urgency    Vomiting     BP 114/80   Pulse 79   Temp 98.2 F (36.8 C) (Oral)   Ht '5\' 4"'$  (1.626 m)   Wt 218 lb (98.9 kg)   SpO2 96%   BMI 37.42 kg/m  Gen: awake, alert, appearing stated age Lungs: No accessory muscle use Skin: linear laceration along plantar surface of R foot near 1/2 MT head with 2 wooden slivers/FB's noted. The FB's are noted in the subcutaneous tissue, not deep to fascia or musculature. It is very TTP. No drainage, erythema, fluctuance, excoriation Psych: Age appropriate judgment and insight  Procedure note:  FB removal Informed consent obtained.  Area cleaned w alcohol and sprayed with topical freeze spray.  I made an initial attempt to anesthetize the area with 2% lidocaine  without epinephrine.  She withdrew her foot twice and after a final attempt was successfully able to inject 1 mL of 2% lidocaine without epinephrine. I made a small incision over the excoriated tissue revealing a small amount of purulent material in addition to 3 separate pieces of splintered wood. I removed a smaller lesion distally after making another small linear incision over the excoriated area. The area was then dressed with triple antibiotic ointment and a dressing. Minus the initial anesthesia, the patient tolerated the procedure well. There were no immediate complications noted.  Foreign body in right foot, initial encounter - Plan: PR REMV FOOT FOREIGN BODY,SUBCUTANEOUS  Right foot infection - Plan: doxycycline (VIBRA-TABS) 100 MG tablet  Successful removal as above.  Aftercare instructions verbalized and written down.  Warning signs and symptoms verbalized written down as well.  She will let me know if anything changes.  Tylenol, ibuprofen, ice. 7 days of doxycycline given purulent material noted.  She is up-to-date with a tetanus shot. F/u prn. The patient and her spouse voiced understanding and agreement to the plan.  [place] DO 01/20/22 12:20 PM

## 2022-01-20 NOTE — Patient Instructions (Addendum)
Do not shower for the rest of the day. When you do wash it, use only soap and water. Do not vigorously scrub. Apply triple antibiotic ointment (like Neosporin) twice daily. Keep the area clean and dry.   Things to look out for: increasing pain not relieved by ibuprofen/acetaminophen, fevers, spreading redness, drainage of pus, or foul odor.  Let us know if you need anything. 

## 2022-01-20 NOTE — Telephone Encounter (Signed)
Pt is scheduled °

## 2022-04-24 ENCOUNTER — Encounter: Payer: Self-pay | Admitting: Family Medicine

## 2022-04-24 ENCOUNTER — Telehealth (INDEPENDENT_AMBULATORY_CARE_PROVIDER_SITE_OTHER): Payer: Medicaid Other | Admitting: Family Medicine

## 2022-04-24 DIAGNOSIS — J329 Chronic sinusitis, unspecified: Secondary | ICD-10-CM

## 2022-04-24 MED ORDER — AZELASTINE HCL 0.1 % NA SOLN: 2.0000 | Freq: Two times a day (BID) | NASAL | 12 refills | Status: DC

## 2022-04-24 MED ORDER — AMOXICILLIN-POT CLAVULANATE 875-125 MG PO TABS: 1.0000 | ORAL_TABLET | Freq: Two times a day (BID) | ORAL | 0 refills | Status: DC

## 2022-04-24 MED ORDER — BENZONATATE 200 MG PO CAPS: 200.0000 mg | ORAL_CAPSULE | Freq: Two times a day (BID) | ORAL | 0 refills | Status: DC | PRN

## 2022-04-24 NOTE — Progress Notes (Signed)
   Sheena Young is a 46 y.o. female who presents today for a virtual office visit.  Assessment/Plan:  Sinusitis No red flags.  Given length of symptoms will start Augmentin.  We will also start Astelin nasal spray.  Start Tessalon for cough.  Encouraged hydration.  Discussed reasons to return to care.  Follow-up as needed.    Subjective:  HPI:  Patient here with sinus congestion. Son was recently diagnosed with a sinus infection and was started on amoxicillin. Symptoms have been going on for the last 2 weeks or more.  Tried taking zinc and xyzal with modest improvement. S a lot of cough.  Some subjective fevers.  Some malaise.       Objective/Observations  Physical Exam: Gen: NAD, resting comfortably Pulm: Normal work of breathing Neuro: Grossly normal, moves all extremities Psych: Normal affect and thought content  Virtual Visit via Video   I connected with Sheena Young on 04/24/22 at 10:40 AM EDT by a video enabled telemedicine application and verified that I am speaking with the correct person using two identifiers. The limitations of evaluation and management by telemedicine and the availability of in person appointments were discussed. The patient expressed understanding and agreed to proceed.   Patient location: Home Provider location: [place] participating in the virtual visit: Myself and Patient     Algis Greenhouse. Jerline Pain, MD 04/24/2022 11:17 AM

## 2022-07-05 NOTE — Progress Notes (Unsigned)
[place] at [facility] [address], [place], [place] [address] [phone]  Date:  07/08/2022   Name:  Sheena Young   DOB:  [birth year]   MRN:  [id number]  PCP:  Darreld Mclean, MD    Chief Complaint: No chief complaint on file.   History of Present Illness:  Sheena Young is a 46 y.o. 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 panic attacks- not diagnosised   Arthritis    Asthma    Chronic back pain    Gastritis, acute    GERD (gastroesophageal reflux disease)    takes Zantac prn   H/O blood clots    pt states calcified clots in pelvis   Headache(784.0)    occaionally   History of kidney stones    History of migraine    last one about 45month   IBS (irritable bowel syndrome)    Insomnia    Joint pain    Joint swelling    [place]romuscular disorder ([facility]    NERVE DAMAGE RIGHT SIDE   Overactive bladder    never filled prescription bc unable to afford   Pneumonia    Shortness of breath dyspnea    with anxiety   Substance abuse ([facility]    MARJUANA   Urinary frequency    Urinary urgency    Vomiting     Past Surgical History:  Procedure Laterality Date   COLONOSCOPY W/ POLYPECTOMY     ECTOPIC PREGNANCY SURGERY     LUMBAR LAMINECTOMY/DECOMPRESSION MICRODISCECTOMY Right  11/03/2012   Procedure: LUMBAR LAMINECTOMY/DECOMPRESSION MICRODISCECTOMY  Right sided lumbar 4-5 microdisectomy;  Surgeon: MSinclair Ship MD;  [facility]  Service: Orthopedics;  Laterality: Right;  Right sided lumbar 4-5 microdisectomy   peridontal surgery     revision decompression  03/2015   decompression and fusion at L4/5, [place]    Social History   Tobacco Use   Smoking status: Former    Packs/day: 0.25    Years: 8.00    Total pack years: 2.00    Types: E-cigarettes, Cigarettes    Quit date: 03/11/2015    Years since quitting: 7.3   Smokeless tobacco: Never   Tobacco comments:    of electronic cigarette  Vaping Use   Vaping Use: Never used  Substance Use Topics   Alcohol use: Yes    Alcohol/week: 0.0 - 2.0 standard drinks of alcohol    Comment: reports once every 10 days as of 09/08/2018   Drug use: Yes    Frequency: 7.0 times per week    Types: Marijuana    Comment: , LAST USED 05/24/19,DAILY    Family History  Problem Relation Age of Onset   Cervical cancer Sister    Diabetes Maternal  Grandmother    Congestive Heart Failure Maternal Grandmother    Lung disease Maternal Grandmother    Diabetes Maternal Aunt        x 3   Diabetes Maternal Uncle        x 3   Heart attack Paternal Grandfather    Heart disease Maternal Uncle    Liver disease Maternal Uncle    Clotting disorder Maternal Grandfather    Colon cancer Paternal Grandmother    Colon polyps Neg Hx    Kidney disease Neg Hx    Esophageal cancer Neg Hx    Rectal cancer Neg Hx    Stomach cancer Neg Hx     Allergies  Allergen Reactions   Grass Pollen(K-O-R-T-Swt Vern)    Mint Chocolate Chip Flavor     Mint and Chocolate   Molds & Smuts    Tree Extract     Medication list has been reviewed and updated.  Current Outpatient Medications on File Prior to Visit  Medication Sig Dispense Refill   amoxicillin-clavulanate (AUGMENTIN) 875-125 MG tablet Take 1 tablet by mouth 2 (two) times  daily. 20 tablet 0   azelastine (ASTELIN) 0.1 % nasal spray Place 2 sprays into both nostrils 2 (two) times daily. 30 mL 12   benzonatate (TESSALON) 200 MG capsule Take 1 capsule (200 mg total) by mouth 2 (two) times daily as needed for cough. 20 capsule 0   cetirizine (ZYRTEC) 10 MG tablet TAKE 1 TABLET(10 MG) BY MOUTH DAILY 90 tablet 1   cyanocobalamin 1000 MCG tablet Take 1,000 mcg by mouth daily. Vita Fusion B-12     dicyclomine (BENTYL) 10 MG capsule Take 1 capsule (10 mg total) by mouth 4 (four) times daily -  before meals and at bedtime. 120 capsule 11   ELDERBERRY PO Take by mouth daily. Zarbees Natural Black Elderberry C, D, Zinc     famotidine (PEPCID) 40 MG tablet Take 1 tablet (40 mg total) by mouth at bedtime. 30 tablet 11   fluticasone (FLONASE) 50 MCG/ACT nasal spray SHAKE LIQUID AND USE 1 SPRAY IN EACH NOSTRIL TWICE DAILY 48 g 3   ipratropium (ATROVENT) 0.03 % nasal spray USE 2 SPRAYS IN EACH NOSTRIL TWICE DAILY 30 mL 0   meloxicam (MOBIC) 15 MG tablet Take 1 tablet (15 mg total) by mouth daily as needed. 30 tablet 1   methocarbamol (ROBAXIN) 500 MG tablet Take 1 tablet (500 mg total) by mouth every 6 (six) hours as needed. 30 tablet 1   montelukast (SINGULAIR) 10 MG tablet TAKE 1 TABLET(10 MG) BY MOUTH AT BEDTIME 30 tablet 5   Multiple Vitamins-Minerals (AIRBORNE PO) Take 500 mg by mouth daily.     PROAIR HFA 108 (90 Base) MCG/ACT inhaler INHALE 2 PUFFS INTO THE LUNGS EVERY 4 HOURS AS NEEDED FOR WHEEZING OR SHORTNESS OF BREATH OR COUGH 8.5 g 1   Probiotic Product (PROBIOTIC DAILY PO) Take 700 mg by mouth daily. PB8-14 billion CFU, 700 mg     RABEprazole (ACIPHEX) 20 MG tablet TAKE 1 TABLET 30-60 MINUTES BEFORE BREAKFAST AND DINNER 60 tablet 0   SYMBICORT 80-4.5 MCG/ACT inhaler INHALE 2 PUFFS INTO THE LUNGS TWICE DAILY 10.2 g 1   No current facility-administered medications on file prior to visit.    Review of Systems:  As per HPI- otherwise negative.   Physical  Examination: There were no vitals filed for this visit. There were no vitals filed for this visit. There is no height or weight on file  to calculate BMI. Ideal Body Weight:    GEN: no acute distress. HEENT: Atraumatic, Normocephalic.  Ears and Nose: No external deformity. CV: RRR, No M/G/R. No JVD. No thrill. No extra heart sounds. PULM: CTA B, no wheezes, crackles, rhonchi. No retractions. No resp. distress. No accessory muscle use. ABD: S, NT, ND, +BS. No rebound. No HSM. EXTR: No c/c/e PSYCH: Normally interactive. Conversant.    Assessment and Plan: *** Physical exam today.  Encouraged healthy diet and exercise routine Will plan further follow- up pending labs.  Signed Lamar Blinks, MD

## 2022-07-05 NOTE — Patient Instructions (Signed)
It was great to see you again today, I will be in touch with your labs Recommend a COVID-19 booster if not done recently Flu shot today Check with your insurance to see if they may cover a GLP-1 drug for weight loss such as BFXOVA or Saxenda I will also ask our pharmacist to see if you qualify for any assistance program  Recommend that you set up a mammogram as well asap!

## 2022-07-08 ENCOUNTER — Encounter (HOSPITAL_BASED_OUTPATIENT_CLINIC_OR_DEPARTMENT_OTHER): Payer: Self-pay

## 2022-07-08 ENCOUNTER — Ambulatory Visit (INDEPENDENT_AMBULATORY_CARE_PROVIDER_SITE_OTHER): Payer: Medicaid Other | Admitting: Family Medicine

## 2022-07-08 ENCOUNTER — Ambulatory Visit (HOSPITAL_BASED_OUTPATIENT_CLINIC_OR_DEPARTMENT_OTHER)
Admission: RE | Admit: 2022-07-08 | Discharge: 2022-07-08 | Disposition: A | Discharge status: Home / Self Care | Payer: Medicaid Other | Source: Ambulatory Visit | Attending: Family Medicine | Admitting: Family Medicine

## 2022-07-08 VITALS — BP 118/70 | HR 78 | Temp 97.5°F | Resp 16 | Ht 65.0 in | Wt 209.6 lb

## 2022-07-08 DIAGNOSIS — R5383 Other fatigue: Secondary | ICD-10-CM

## 2022-07-08 DIAGNOSIS — Z131 Encounter for screening for diabetes mellitus: Secondary | ICD-10-CM

## 2022-07-08 DIAGNOSIS — Z1329 Encounter for screening for other suspected endocrine disorder: Secondary | ICD-10-CM

## 2022-07-08 DIAGNOSIS — Z23 Encounter for immunization: Secondary | ICD-10-CM

## 2022-07-08 DIAGNOSIS — Z13220 Encounter for screening for lipoid disorders: Secondary | ICD-10-CM

## 2022-07-08 DIAGNOSIS — Z1231 Encounter for screening mammogram for malignant neoplasm of breast: Secondary | ICD-10-CM | POA: Insufficient documentation

## 2022-07-08 DIAGNOSIS — Z0000 Encounter for general adult medical examination without abnormal findings: Secondary | ICD-10-CM | POA: Diagnosis not present

## 2022-07-08 DIAGNOSIS — Z130 Encounter for screening for diseases of the blood and blood-forming organs and certain disorders involving the immune mechanism: Secondary | ICD-10-CM

## 2022-07-08 DIAGNOSIS — E669 Obesity, unspecified: Secondary | ICD-10-CM

## 2022-07-22 ENCOUNTER — Encounter: Payer: Self-pay | Admitting: Family Medicine

## 2022-08-12 ENCOUNTER — Encounter: Payer: Self-pay | Admitting: Family Medicine

## 2022-08-12 ENCOUNTER — Ambulatory Visit: Payer: Medicaid Other | Admitting: Family Medicine

## 2022-08-12 VITALS — BP 114/70 | HR 95 | Temp 98.0°F | Ht 65.0 in | Wt 205.0 lb

## 2022-08-12 DIAGNOSIS — R8290 Unspecified abnormal findings in urine: Secondary | ICD-10-CM | POA: Diagnosis not present

## 2022-08-12 DIAGNOSIS — R509 Fever, unspecified: Secondary | ICD-10-CM

## 2022-08-12 DIAGNOSIS — Z13220 Encounter for screening for lipoid disorders: Secondary | ICD-10-CM

## 2022-08-12 DIAGNOSIS — Z1329 Encounter for screening for other suspected endocrine disorder: Secondary | ICD-10-CM | POA: Diagnosis not present

## 2022-08-12 DIAGNOSIS — R051 Acute cough: Secondary | ICD-10-CM | POA: Diagnosis not present

## 2022-08-12 DIAGNOSIS — B001 Herpesviral vesicular dermatitis: Secondary | ICD-10-CM | POA: Diagnosis not present

## 2022-08-12 DIAGNOSIS — R112 Nausea with vomiting, unspecified: Secondary | ICD-10-CM

## 2022-08-12 DIAGNOSIS — Z131 Encounter for screening for diabetes mellitus: Secondary | ICD-10-CM | POA: Diagnosis not present

## 2022-08-12 LAB — COMPREHENSIVE METABOLIC PANEL
ALT: 23 U/L (ref 0–35)
AST: 21 U/L (ref 0–37)
Albumin: 4.2 g/dL (ref 3.5–5.2)
Alkaline Phosphatase: 48 U/L (ref 39–117)
BUN: 12 mg/dL (ref 6–23)
CO2: 26 mEq/L (ref 19–32)
Calcium: 8.6 mg/dL (ref 8.4–10.5)
Chloride: 105 mEq/L (ref 96–112)
Creatinine, Ser: 0.7 mg/dL (ref 0.40–1.20)
GFR: 105.42 mL/min (ref >60.00)
Glucose, Bld: 83 mg/dL (ref 70–99)
Potassium: 3.8 mEq/L (ref 3.5–5.1)
Sodium: 137 mEq/L (ref 135–145)
Total Bilirubin: 0.4 mg/dL (ref 0.2–1.2)
Total Protein: 6.7 g/dL (ref 6.0–8.3)

## 2022-08-12 LAB — POCT URINALYSIS DIP (MANUAL ENTRY)
Blood, UA: NEGATIVE
Glucose, UA: NEGATIVE mg/dL
Leukocytes, UA: NEGATIVE
Nitrite, UA: NEGATIVE
Protein Ur, POC: 30 mg/dL — AB
Spec Grav, UA: 1.02 (ref 1.010–1.025)
Urobilinogen, UA: 0.2 E.U./dL
pH, UA: 6 (ref 5.0–8.0)

## 2022-08-12 LAB — CBC
HCT: 40.7 % (ref 36.0–46.0)
Hemoglobin: 13.8 g/dL (ref 12.0–15.0)
MCHC: 34 g/dL (ref 30.0–36.0)
MCV: 93.8 fl (ref 78.0–100.0)
Platelets: 314 10*3/uL (ref 150.0–400.0)
RBC: 4.34 Mil/uL (ref 3.87–5.11)
RDW: 13.8 % (ref 11.5–15.5)
WBC: 9 10*3/uL (ref 4.0–10.5)

## 2022-08-12 LAB — HEMOGLOBIN A1C: Hgb A1c MFr Bld: 5.3 % (ref 4.6–6.5)

## 2022-08-12 LAB — LIPID PANEL
Cholesterol: 136 mg/dL (ref 0–200)
HDL: 31.5 mg/dL — ABNORMAL LOW (ref >39.00)
LDL Cholesterol: 84 mg/dL (ref 0–99)
NonHDL: 104.4
Total CHOL/HDL Ratio: 4
Triglycerides: 104 mg/dL (ref 0.0–149.0)
VLDL: 20.8 mg/dL (ref 0.0–40.0)

## 2022-08-12 LAB — POCT INFLUENZA A/B
Influenza A, POC: NEGATIVE
Influenza B, POC: NEGATIVE

## 2022-08-12 LAB — TSH: TSH: 1.2 u[IU]/mL (ref 0.35–5.50)

## 2022-08-12 LAB — POC COVID19 BINAXNOW: SARS Coronavirus 2 Ag: NEGATIVE

## 2022-08-12 MED ORDER — ONDANSETRON HCL 4 MG PO TABS: 4.0000 mg | ORAL_TABLET | Freq: Three times a day (TID) | ORAL | 0 refills | Status: AC | PRN

## 2022-08-12 MED ORDER — VALACYCLOVIR HCL 1 G PO TABS: ORAL_TABLET | ORAL | 0 refills | Status: AC

## 2022-08-12 MED ORDER — OSELTAMIVIR PHOSPHATE 75 MG PO CAPS: 75.0000 mg | ORAL_CAPSULE | Freq: Two times a day (BID) | ORAL | 0 refills | Status: DC

## 2022-08-12 NOTE — Progress Notes (Signed)
[facility] [place], Sheena Young, [place] [address] [phone]  Date:  08/12/2022   Name:  Sheena Young   DOB:  [birth year]   MRN:  [id number]  PCP:  Darreld Mclean, MD    Chief Complaint: Fatigue (Onset 08/09/2022)   History of Present Illness:  Sheena Young is a 47 y.o. very pleasant female patient who presents with the following:  Pt seen today with illness Last seen by myself for her CPE in December  She got ill 3 days ago- she was exposed to flu via an ill family member Sx are cough, vomiting, headache, fatigue She has felt clammy and had a low grade fever-subjective Emesis is non post- tussive No diarrhea  Her 45 yo is sick as well; she is taking him to the doctor right after our appointment  LMP was 1/8 She has noted some urine odor as well- no dysuria but wonders if she might be getting a UTI  Patient Active Problem List   Diagnosis Date Noted   Cough variant asthma  vs UACS 09/08/2018   Radiculopathy 03/13/2015   Perforated ear drum 10/24/2008    Past Medical History:  Diagnosis Date   Allergy    Anxiety    but doesn't take any meds for this- ? panic attacks- not diagnosised   Arthritis    Asthma    Chronic back pain    Gastritis, acute    GERD (gastroesophageal reflux disease)    takes Zantac prn   H/O blood clots    pt states calcified clots in pelvis   Headache(784.0)    occaionally   History of kidney stones    History of migraine    last one about 32month   IBS (irritable bowel syndrome)    Insomnia    Joint pain    Joint swelling    Ne[place]romuscular disorder ([place]    NERVE DAMAGE RIGHT SIDE   Overactive bladder    never filled prescription bc unable to afford   Pneumonia    Shortness of breath dyspnea    with anxiety   Substance abuse ([facility]    MARJUANA   Urinary frequency    Urinary urgency    Vomiting     Past Surgical History:  Procedure Laterality Date    COLONOSCOPY W/ POLYPECTOMY     ECTOPIC PREGNANCY SURGERY     LUMBAR LAMINECTOMY/DECOMPRESSION MICRODISCECTOMY Right 11/03/2012   Procedure: LUMBAR LAMINECTOMY/DECOMPRESSION MICRODISCECTOMY  Right sided lumbar 4-5 microdisectomy;  Surgeon: MSinclair Ship MD;  [place] [facility]  Service: Orthopedics;  Laterality: Right;  Right sided lumbar 4-5 microdisectomy   peridontal surgery     revision decompression  03/2015   decompression and fusion at L4/5, [place]    Social History   Tobacco Use   Smoking status: Former    Packs/day: 0.25    Years: 8.00    Total pack years: 2.00    Types: E-cigarettes, Cigarettes    Quit date: 03/11/2015    Years since quitting: 7.4   Smokeless tobacco: Never   Tobacco comments:    of electronic cigarette  Vaping Use   Vaping Use: Never used  Substance Use Topics   Alcohol use: Yes    Alcohol/week: 0.0 - 2.0 standard drinks of alcohol    Comment: reports once every 10 days as of 09/08/2018   Drug use: Yes    Frequency: 7.0 times per week  Types: Marijuana    Comment: , LAST USED 05/24/19,DAILY    Family History  Problem Relation Age of Onset   Cervical cancer Sister    Diabetes Maternal Grandmother    Congestive Heart Failure Maternal Grandmother    Lung disease Maternal Grandmother    Diabetes Maternal Aunt        x 3   Diabetes Maternal Uncle        x 3   Heart attack Paternal Grandfather    Heart disease Maternal Uncle    Liver disease Maternal Uncle    Clotting disorder Maternal Grandfather    Colon cancer Paternal Grandmother    Colon polyps Neg Hx    Kidney disease Neg Hx    Esophageal cancer Neg Hx    Rectal cancer Neg Hx    Stomach cancer Neg Hx     Allergies  Allergen Reactions   Grass Pollen(K-O-R-T-Swt Vern)    Mint Chocolate Chip Flavor     Mint and Chocolate   Molds & Smuts    Tree Extract     Medication list has been reviewed and updated.  Current Outpatient Medications on File Prior to Visit   Medication Sig Dispense Refill   cetirizine (ZYRTEC) 10 MG tablet TAKE 1 TABLET(10 MG) BY MOUTH DAILY 90 tablet 1   cyanocobalamin 1000 MCG tablet Take 1,000 mcg by mouth daily. Vita Fusion B-12     famotidine (PEPCID) 40 MG tablet Take 1 tablet (40 mg total) by mouth at bedtime. 30 tablet 11   meloxicam (MOBIC) 15 MG tablet Take 1 tablet (15 mg total) by mouth daily as needed. 30 tablet 1   montelukast (SINGULAIR) 10 MG tablet TAKE 1 TABLET(10 MG) BY MOUTH AT BEDTIME 30 tablet 5   PROAIR HFA 108 (90 Base) MCG/ACT inhaler INHALE 2 PUFFS INTO THE LUNGS EVERY 4 HOURS AS NEEDED FOR WHEEZING OR SHORTNESS OF BREATH OR COUGH 8.5 g 1   Probiotic Product (PROBIOTIC DAILY PO) Take 700 mg by mouth daily. PB8-14 billion CFU, 700 mg     RABEprazole (ACIPHEX) 20 MG tablet TAKE 1 TABLET 30-60 MINUTES BEFORE BREAKFAST AND DINNER 60 tablet 0   SYMBICORT 80-4.5 MCG/ACT inhaler INHALE 2 PUFFS INTO THE LUNGS TWICE DAILY 10.2 g 1   dicyclomine (BENTYL) 10 MG capsule Take 1 capsule (10 mg total) by mouth 4 (four) times daily -  before meals and at bedtime. 120 capsule 11   ELDERBERRY PO Take by mouth daily. Zarbees Natural Black Elderberry C, D, Zinc     fluticasone (FLONASE) 50 MCG/ACT nasal spray SHAKE LIQUID AND USE 1 SPRAY IN EACH NOSTRIL TWICE DAILY 48 g 3   ipratropium (ATROVENT) 0.03 % nasal spray USE 2 SPRAYS IN EACH NOSTRIL TWICE DAILY 30 mL 0   methocarbamol (ROBAXIN) 500 MG tablet Take 1 tablet (500 mg total) by mouth every 6 (six) hours as needed. 30 tablet 1   Multiple Vitamins-Minerals (AIRBORNE PO) Take 500 mg by mouth daily.     No current facility-administered medications on file prior to visit.    Review of Systems:  As per HPI- otherwise negative.   Physical Examination: Vitals:   08/12/22 0924  BP: 114/70  Pulse: 95  Temp: 98 F (36.7 C)  SpO2: 100%   Vitals:   08/12/22 0924  Weight: 205 lb (93 kg)  Height: '5\' 5"'$  (1.651 m)   Body mass index is 34.11 kg/m. Ideal Body Weight:  Weight in (lb) to have BMI = 25: 149.9  GEN: no acute distress.  Obese, coughing and does not appear to feel well HEENT: Atraumatic, Normocephalic.  Oropharynx normal Ears and Nose: No external deformity. CV: RRR, No M/G/R. No JVD. No thrill. No extra heart sounds. PULM: CTA B, no wheezes, crackles, rhonchi. No retractions. No resp. distress. No accessory muscle use. ABD: S, NT, ND. No rebound. No HSM. EXTR: No c/c/e PSYCH: Normally interactive. Conversant.  Cold sore on right upper lip Left TM chronically scarred-per patient this is stable  Results for orders placed or performed in visit on 08/12/22  POC COVID-19 BinaxNow  Result Value Ref Range   SARS Coronavirus 2 Ag Negative Negative  POCT Influenza A/B  Result Value Ref Range   Influenza A, POC Negative Negative   Influenza B, POC Negative Negative  POCT urinalysis dipstick  Result Value Ref Range   Color, UA yellow yellow   Clarity, UA clear clear   Glucose, UA negative negative mg/dL   Bilirubin, UA moderate (A) negative   Ketones, POC UA large (80) (A) negative mg/dL   Spec Grav, UA 1.020 1.010 - 1.025   Blood, UA negative negative   pH, UA 6.0 5.0 - 8.0   Protein Ur, POC =30 (A) negative mg/dL   Urobilinogen, UA 0.2 0.2 or 1.0 E.U./dL   Nitrite, UA Negative Negative   Leukocytes, UA Negative Negative    Assessment and Plan: Low grade fever - Plan: POC COVID-19 BinaxNow, POCT Influenza A/B, oseltamivir (TAMIFLU) 75 MG capsule, CBC  Acute cough - Plan: POC COVID-19 BinaxNow, POCT Influenza A/B, oseltamivir (TAMIFLU) 75 MG capsule, CBC  Abnormal urine odor - Plan: POCT urinalysis dipstick, Urine Culture  Herpes labialis without complication - Plan: valACYclovir (VALTREX) 1000 MG tablet  Nausea and vomiting, unspecified vomiting type - Plan: ondansetron (ZOFRAN) 4 MG tablet  Screening for diabetes mellitus - Plan: Comprehensive metabolic panel, Hemoglobin A1c  Screening, lipid - Plan: Lipid panel  Screening  for thyroid disorder - Plan: TSH Patient seen today with illness.  She notes her 51-monthold has been ill recently, she was also exposed to family member with the flu.  She tested negative for flu today but certainly her symptoms are suspicious.  I gave her a prescription for Tamiflu to use if she wishes.  I also prescribed ondansetron to use as needed for nausea, Valtrex for cold sore Encouraged hydration, rest, over-the-counter medications for symptoms as needed.  She is asked to please let me know if she is not improving or if she should get worse.  Advised her urine shows evidence of dehydration, she will work on oral hydration  She is behind on her routine lab work, will draw blood today  Signed JLamar Blinks MD  Received labs as below, message to patient  Results for orders placed or performed in visit on 08/12/22  CBC  Result Value Ref Range   WBC 9.0 4.0 - 10.5 K/uL   RBC 4.34 3.87 - 5.11 Mil/uL   Platelets 314.0 150.0 - 400.0 K/uL   Hemoglobin 13.8 12.0 - 15.0 g/dL   HCT 40.7 36.0 - 46.0 %   MCV 93.8 78.0 - 100.0 fl   MCHC 34.0 30.0 - 36.0 g/dL   RDW 13.8 11.5 - 15.5 %  Comprehensive metabolic panel  Result Value Ref Range   Sodium 137 135 - 145 mEq/L   Potassium 3.8 3.5 - 5.1 mEq/L   Chloride 105 96 - 112 mEq/L   CO2 26 19 - 32 mEq/L   Glucose, Bld 83  70 - 99 mg/dL   BUN 12 6 - 23 mg/dL   Creatinine, Ser 0.70 0.40 - 1.20 mg/dL   Total Bilirubin 0.4 0.2 - 1.2 mg/dL   Alkaline Phosphatase 48 39 - 117 U/L   AST 21 0 - 37 U/L   ALT 23 0 - 35 U/L   Total Protein 6.7 6.0 - 8.3 g/dL   Albumin 4.2 3.5 - 5.2 g/dL   GFR 105.42 >60.00 mL/min   Calcium 8.6 8.4 - 10.5 mg/dL  Hemoglobin A1c  Result Value Ref Range   Hgb A1c MFr Bld 5.3 4.6 - 6.5 %  Lipid panel  Result Value Ref Range   Cholesterol 136 0 - 200 mg/dL   Triglycerides 104.0 0.0 - 149.0 mg/dL   HDL 31.50 (L) >39.00 mg/dL   VLDL 20.8 0.0 - 40.0 mg/dL   LDL Cholesterol 84 0 - 99 mg/dL   Total CHOL/HDL Ratio  4    NonHDL 104.40   TSH  Result Value Ref Range   TSH 1.20 0.35 - 5.50 uIU/mL  POC COVID-19 BinaxNow  Result Value Ref Range   SARS Coronavirus 2 Ag Negative Negative  POCT Influenza A/B  Result Value Ref Range   Influenza A, POC Negative Negative   Influenza B, POC Negative Negative  POCT urinalysis dipstick  Result Value Ref Range   Color, UA yellow yellow   Clarity, UA clear clear   Glucose, UA negative negative mg/dL   Bilirubin, UA moderate (A) negative   Ketones, POC UA large (80) (A) negative mg/dL   Spec Grav, UA 1.020 1.010 - 1.025   Blood, UA negative negative   pH, UA 6.0 5.0 - 8.0   Protein Ur, POC =30 (A) negative mg/dL   Urobilinogen, UA 0.2 0.2 or 1.0 E.U./dL   Nitrite, UA Negative Negative   Leukocytes, UA Negative Negative

## 2022-08-12 NOTE — Patient Instructions (Signed)
It was good to see you today- I am sorry you are sick!  I will be in touch with your labs asap You may have the flu- ok to take tamiflu if you like I also sent in some valtrx to use as needed for cold sore and ondansetron to use for nausea Try to push fluids, rest If you are not doing ok please let me know or seek care at the ER for fluids if needed

## 2022-08-13 LAB — URINE CULTURE
MICRO NUMBER:: 14499259
SPECIMEN QUALITY:: ADEQUATE

## 2022-08-14 ENCOUNTER — Encounter: Payer: Self-pay | Admitting: Family Medicine

## 2023-10-13 NOTE — Progress Notes (Unsigned)
 [facility] [address] [place], [place] [address] [phone]  Date:  10/14/2023   Name:  Sheena Young   DOB:  [birth year]   MRN:  [id number]  PCP:  Pearline Cables, MD    Chief Complaint: R shoulder pain (Limited ROM. Has been seen in the past for this. Pain radiates into the neck and clavicle. Takes Aleve prn, warm/cool compresses. )   History of Present Illness:  Sheena Young is a 48 y.o. very pleasant female patient who presents with the following:  Generally in good health, some history of asthma  Pt seen today with concern about her arm-she is having pain in her right shoulder for about 8 months, getting worse Last seen by myself in January when she was ill  She has a nearly 27 yo son who is about 35 lbs.  He needs to be lifted frequently- to get in and out of the carseat, etc.    It is bothering her more and more, waking her up at night  She has not yet seen ortho for her shoulder   She is using hot and hold compresses and aleve Alever helps her some  She notes she cannot reach her in her back with her right arm, such as to do up her bra Patient Active Problem List   Diagnosis Date Noted   Cough variant asthma  vs UACS 09/08/2018   Radiculopathy 03/13/2015   Perforated ear drum 10/24/2008    Past Medical History:  Diagnosis Date   Allergy    Anxiety    but doesn't take any meds for this- ? panic attacks- not diagnosised   Arthritis    Asthma    Chronic back pain    Gastritis, acute    GERD (gastroesophageal reflux disease)    takes Zantac prn   H/O blood clots    pt states calcified clots in pelvis   Headache(784.0)    occaionally   History of kidney stones    History of migraine    last one about 6months   IBS (irritable bowel syndrome)    Insomnia    Joint pain    Joint swelling    Neuromuscular disorder (H[facility])    NERVE DAMAGE RIGHT SIDE   Overactive bladder    never  filled prescription bc unable to afford   Pneumonia    Shortness of breath dyspnea    with anxiety   Substance abuse ([facility])    MARJUANA   Urinary frequency    Urinary urgency    Vomiting     Past Surgical History:  Procedure Laterality Date   COLONOSCOPY W/ POLYPECTOMY     ECTOPIC PREGNANCY SURGERY     LUMBAR LAMINECTOMY/DECOMPRESSION MICRODISCECTOMY Right 11/03/2012   Procedure: LUMBAR LAMINECTOMY/DECOMPRESSION MICRODISCECTOMY  Right sided lumbar 4-5 microdisectomy;  Surgeon: Emilee Hero, MD;  Location: [facility];  Service: Orthopedics;  Laterality: Right;  Right sided lumbar 4-5 microdisectomy   peridontal surgery     revision decompression  03/2015   decompression and fusion at L4/5, [place]    Social History   Tobacco Use   Smoking status: Former    Current packs/day: 0.00    Average packs/day: 0.3 packs/day for 8.0 years (2.0 ttl pk-yrs)    Types: E-cigarettes, Cigarettes    Start date: 03/11/2007    Quit date: 03/11/2015    Years since quitting: 8.6   Smokeless tobacco: Never  Tobacco comments:    of electronic cigarette  Vaping Use   Vaping status: Never Used  Substance Use Topics   Alcohol use: Yes    Alcohol/week: 0.0 - 2.0 standard drinks of alcohol    Comment: reports once every 10 days as of 09/08/2018   Drug use: Yes    Frequency: 7.0 times per week    Types: Marijuana    Comment: , LAST USED 05/24/19,DAILY    Family History  Problem Relation Age of Onset   Cervical cancer Sister    Diabetes Maternal Grandmother    Congestive Heart Failure Maternal Grandmother    Lung disease Maternal Grandmother    Diabetes Maternal Aunt        x 3   Diabetes Maternal Uncle        x 3   Heart attack Paternal Grandfather    Heart disease Maternal Uncle    Liver disease Maternal Uncle    Clotting disorder Maternal Grandfather    Colon cancer Paternal Grandmother    Colon polyps Neg Hx    Kidney disease Neg Hx    Esophageal cancer Neg Hx    Rectal cancer  Neg Hx    Stomach cancer Neg Hx     Allergies  Allergen Reactions   Grass Pollen(K-O-R-T-Swt Vern)    Mint Chocolate Chip Flavoring (Non-Screening)     Mint and Chocolate   Molds & Smuts    Tree Extract     Medication list has been reviewed and updated.  Current Outpatient Medications on File Prior to Visit  Medication Sig Dispense Refill   cetirizine (ZYRTEC) 10 MG tablet TAKE 1 TABLET(10 MG) BY MOUTH DAILY 90 tablet 1   cyanocobalamin 1000 MCG tablet Take 1,000 mcg by mouth daily. Vita Fusion B-12     famotidine (PEPCID) 40 MG tablet Take 1 tablet (40 mg total) by mouth at bedtime. 30 tablet 11   meloxicam (MOBIC) 15 MG tablet Take 1 tablet (15 mg total) by mouth daily as needed. 30 tablet 1   montelukast (SINGULAIR) 10 MG tablet TAKE 1 TABLET(10 MG) BY MOUTH AT BEDTIME 30 tablet 5   ondansetron (ZOFRAN) 4 MG tablet Take 1 tablet (4 mg total) by mouth every 8 (eight) hours as needed for nausea or vomiting. 30 tablet 0   PROAIR HFA 108 (90 Base) MCG/ACT inhaler INHALE 2 PUFFS INTO THE LUNGS EVERY 4 HOURS AS NEEDED FOR WHEEZING OR SHORTNESS OF BREATH OR COUGH 8.5 g 1   Probiotic Product (PROBIOTIC DAILY PO) Take 700 mg by mouth daily. PB8-14 billion CFU, 700 mg     RABEprazole (ACIPHEX) 20 MG tablet TAKE 1 TABLET 30-60 MINUTES BEFORE BREAKFAST AND DINNER 60 tablet 0   SYMBICORT 80-4.5 MCG/ACT inhaler INHALE 2 PUFFS INTO THE LUNGS TWICE DAILY 10.2 g 1   valACYclovir (VALTREX) 1000 MG tablet Take 2000 mg once for cold sore, repeat in 12 hours.  Can repeat dosage as needed for future cold sore 20 tablet 0   No current facility-administered medications on file prior to visit.    Review of Systems:  As per HPI- otherwise negative.   Physical Examination: Vitals:   10/14/23 1504  BP: 118/62  Pulse: 81  Resp: 18  Temp: 97.6 F (36.4 C)  SpO2: 99%   Vitals:   10/14/23 1504  Weight: 195 lb 6.4 oz (88.6 kg)  Height: 5\' 5"  (1.651 m)   Body mass index is 32.52 kg/m. Ideal  Body Weight: Weight in (lb) to  have BMI = 25: 149.9  GEN: no acute distress.  Mildly obese, looks well HEENT: Atraumatic, Normocephalic.  Ears and Nose: No external deformity. CV: RRR, No M/G/R. No JVD. No thrill. No extra heart sounds. PULM: CTA B, no wheezes, crackles, rhonchi. No retractions. No resp. distress. No accessory muscle use. EXTR: No c/c/e PSYCH: Normally interactive. Conversant.  Right shoulder displays slightly limited range of motion to flexion and abduction, she also has discomfort with testing her range of motion.  External rotation is normal, internal rotation is reduced on the right and painful.  She has tenderness over the right rotator cuff insertion, positive Hawkins sign.  Negative empty can.  Normal strength of biceps, triceps, deltoids  Assessment and Plan: Rotator cuff tendonitis, right - Plan: celecoxib (CELEBREX) 200 MG capsule, Ambulatory referral to Orthopedic Surgery  Patient seen today with likely rotator cuff tendinitis due to lifting her toddler.  I do not think she has a significant tear as she has good strength and range of motion.  Gave her a prescription for Celebrex and some home exercises, referral to orthopedics for further management.  Signed Abbe Amsterdam, MD

## 2023-10-13 NOTE — Patient Instructions (Incomplete)
 Good to see you again today

## 2023-10-14 ENCOUNTER — Ambulatory Visit: Admitting: Family Medicine

## 2023-10-14 VITALS — BP 118/62 | HR 81 | Temp 97.6°F | Resp 18 | Ht 65.0 in | Wt 195.4 lb

## 2023-10-14 DIAGNOSIS — M7581 Other shoulder lesions, right shoulder: Secondary | ICD-10-CM | POA: Diagnosis not present

## 2023-10-14 MED ORDER — CELECOXIB 200 MG PO CAPS: 200.0000 mg | ORAL_CAPSULE | Freq: Every day | ORAL | 3 refills | Status: AC

## 2023-11-12 ENCOUNTER — Encounter: Payer: Self-pay | Admitting: Family Medicine

## 2024-02-18 ENCOUNTER — Telehealth: Payer: Self-pay | Admitting: Family Medicine

## 2024-02-18 MED ORDER — AZELASTINE HCL 0.1 % NA SOLN: 1.0000 | Freq: Two times a day (BID) | NASAL | 12 refills | Status: AC

## 2024-02-18 NOTE — Telephone Encounter (Signed)
 Copied from CRM [id number]. Topic: Clinical - Medication Refill >> Feb 18, 2024 12:25 PM Franky GRADE wrote: Medication: azelastine  (ASTELIN ) 0.1 % nasal spray [[id number]]  Has the patient contacted their pharmacy? Yes,pharmacy is calling to place the refill.  (Agent: If no, request that the patient contact the pharmacy for the refill. If patient does not wish to contact the pharmacy document the reason why and proceed with request.) (Agent: If yes, when and what did the pharmacy advise?)  This is the patient's preferred pharmacy:  [id number] - [place], [place] - [id number] [place] [address] Phone: [phone] Fax: [phone]  Is this the correct pharmacy for this prescription? Yes If no, delete pharmacy and type the correct one.   Has the prescription been filled recently? No  Is the patient out of the medication? Yes  Has the patient been seen for an appointment in the last year OR does the patient have an upcoming appointment? Yes  Can we respond through MyChart? Yes  Agent: Please be advised that Rx refills may take up to 3 business days. We ask that you follow-up with your pharmacy.
